# Patient Record
Sex: Male | Born: 1975 | Race: Black or African American | Hispanic: No | Marital: Single | Smoking: Current every day smoker
Health system: Southern US, Community
[De-identification: ages and names within clinical notes are randomized; demographics above are authoritative.]

## PROBLEM LIST (undated history)

## (undated) DIAGNOSIS — I10 Essential (primary) hypertension: Secondary | ICD-10-CM

## (undated) DIAGNOSIS — S02609A Fracture of mandible, unspecified, initial encounter for closed fracture: Secondary | ICD-10-CM

## (undated) DIAGNOSIS — M1990 Unspecified osteoarthritis, unspecified site: Secondary | ICD-10-CM

## (undated) HISTORY — PX: KNEE SURGERY: SHX244

---

## 2003-05-10 ENCOUNTER — Emergency Department (HOSPITAL_COMMUNITY): Admission: EM | Admit: 2003-05-10 | Discharge: 2003-05-10 | Payer: Self-pay | Admitting: Emergency Medicine

## 2003-05-10 ENCOUNTER — Encounter: Payer: Self-pay | Admitting: Emergency Medicine

## 2003-05-14 ENCOUNTER — Emergency Department (HOSPITAL_COMMUNITY): Admission: EM | Admit: 2003-05-14 | Discharge: 2003-05-14 | Payer: Self-pay | Admitting: Emergency Medicine

## 2014-01-09 ENCOUNTER — Emergency Department (HOSPITAL_COMMUNITY)
Admission: EM | Admit: 2014-01-09 | Discharge: 2014-01-09 | Disposition: A | Discharge status: Home / Self Care | Payer: Self-pay | Attending: Emergency Medicine | Admitting: Emergency Medicine

## 2014-01-09 ENCOUNTER — Encounter (HOSPITAL_COMMUNITY): Payer: Self-pay | Admitting: Emergency Medicine

## 2014-01-09 DIAGNOSIS — K921 Melena: Secondary | ICD-10-CM | POA: Insufficient documentation

## 2014-01-09 DIAGNOSIS — R109 Unspecified abdominal pain: Secondary | ICD-10-CM

## 2014-01-09 DIAGNOSIS — R1013 Epigastric pain: Secondary | ICD-10-CM | POA: Insufficient documentation

## 2014-01-09 DIAGNOSIS — F17200 Nicotine dependence, unspecified, uncomplicated: Secondary | ICD-10-CM | POA: Insufficient documentation

## 2014-01-09 DIAGNOSIS — Z79899 Other long term (current) drug therapy: Secondary | ICD-10-CM | POA: Insufficient documentation

## 2014-01-09 DIAGNOSIS — Z8739 Personal history of other diseases of the musculoskeletal system and connective tissue: Secondary | ICD-10-CM | POA: Insufficient documentation

## 2014-01-09 DIAGNOSIS — K625 Hemorrhage of anus and rectum: Secondary | ICD-10-CM | POA: Insufficient documentation

## 2014-01-09 HISTORY — DX: Unspecified osteoarthritis, unspecified site: M19.90

## 2014-01-09 LAB — COMPREHENSIVE METABOLIC PANEL
ALK PHOS: 52 U/L (ref 39–117)
ALT: 22 U/L (ref 0–53)
AST: 23 U/L (ref 0–37)
Albumin: 3.8 g/dL (ref 3.5–5.2)
BILIRUBIN TOTAL: 0.4 mg/dL (ref 0.3–1.2)
BUN: 17 mg/dL (ref 6–23)
CHLORIDE: 102 meq/L (ref 96–112)
CO2: 30 meq/L (ref 19–32)
Calcium: 9.6 mg/dL (ref 8.4–10.5)
Creatinine, Ser: 1.04 mg/dL (ref 0.50–1.35)
GFR calc Af Amer: >90 mL/min (ref >90)
GFR, EST NON AFRICAN AMERICAN: 90 mL/min — AB (ref >90)
Glucose, Bld: 104 mg/dL — ABNORMAL HIGH (ref 70–99)
Potassium: 5 mEq/L (ref 3.7–5.3)
SODIUM: 142 meq/L (ref 137–147)
Total Protein: 7.3 g/dL (ref 6.0–8.3)

## 2014-01-09 LAB — CBC
HCT: 42.1 % (ref 39.0–52.0)
Hemoglobin: 14 g/dL (ref 13.0–17.0)
MCH: 29.7 pg (ref 26.0–34.0)
MCHC: 33.3 g/dL (ref 30.0–36.0)
MCV: 89.4 fL (ref 78.0–100.0)
PLATELETS: 247 10*3/uL (ref 150–400)
RBC: 4.71 MIL/uL (ref 4.22–5.81)
RDW: 14.7 % (ref 11.5–15.5)
WBC: 6.1 10*3/uL (ref 4.0–10.5)

## 2014-01-09 LAB — PROTIME-INR
INR: 0.99 (ref 0.00–1.49)
Prothrombin Time: 12.9 seconds (ref 11.6–15.2)

## 2014-01-09 LAB — OCCULT BLOOD, POC DEVICE: FECAL OCCULT BLD: NEGATIVE

## 2014-01-09 LAB — APTT: aPTT: 31 seconds (ref 24–37)

## 2014-01-09 MED ORDER — OMEPRAZOLE 20 MG PO CPDR: 20.0000 mg | DELAYED_RELEASE_CAPSULE | Freq: Every day | ORAL | Status: DC

## 2014-01-09 MED ORDER — SODIUM CHLORIDE 0.9 % IV SOLN
1000.0000 mL | INTRAVENOUS | Status: DC
  Administered 2014-01-09: 1000 mL via INTRAVENOUS

## 2014-01-09 NOTE — ED Notes (Signed)
Pt reports bleeding in stool that was dark red. Last bloody stool was a week ago and pt stated he noticed clots. Pt also reports ab pain that is generalized that comes and goes and is sharp.

## 2014-01-09 NOTE — Progress Notes (Signed)
[facility] provided pt with a list of primary care resources, [facility] information, and a [facility] [employer] application.  °

## 2014-01-09 NOTE — ED Provider Notes (Signed)
CSN: [id number]     Arrival date & time 01/09/14  8295 History   First MD Initiated Contact with Patient 01/09/14 [phone]     Chief Complaint  Patient presents with  . Abdominal Pain  . Rectal Bleeding    Patient is a 38 y.o. male presenting with abdominal pain and hematochezia. The history is provided by the patient.  Abdominal Pain Pain location:  Epigastric Pain quality: sharp   Pain radiates to:  Does not radiate Duration:  3 weeks (intermittent, lasts for 30 seconds at a time) Timing:  Intermittent Context: not alcohol use, not eating, not sick contacts, not suspicious food intake and not trauma   Relieved by: sometimes pressing on his abdomen helps. Worsened by:  Nothing tried Associated symptoms: hematochezia   Associated symptoms: no anorexia, no cough, no diarrhea, no dysuria, no fatigue, no fever, no shortness of breath and no vomiting   Rectal Bleeding Quality: Pt noticed blood in his stool after having a bowel movement and wiping.  He did notice a clot in his stool about one week ago.  None since that time. Associated symptoms: abdominal pain   Associated symptoms: no fever and no vomiting    Pt was reading about his symptoms on the internet and was concerned about the possibility of colon cancer.  No family history of this.  No weight loss, fevers, vomiting, diarrhea.  Past Medical History  Diagnosis Date  . Arthritis    Past Surgical History  Procedure Laterality Date  . Knee surgery     No family history on file. History  Substance Use Topics  . Smoking status: Current Every Day Smoker    Types: Cigarettes  . Smokeless tobacco: Not on file  . Alcohol Use: No    Review of Systems  Constitutional: Negative for fever and fatigue.  Respiratory: Negative for cough and shortness of breath.   Gastrointestinal: Positive for abdominal pain and hematochezia. Negative for vomiting, diarrhea and anorexia.  Genitourinary: Negative for dysuria.  All other systems  reviewed and are negative.    Allergies  Review of patient's allergies indicates no known allergies.  Home Medications   Current Outpatient Rx  Name  Route  Sig  Dispense  Refill  . omeprazole (PRILOSEC) 20 MG capsule   Oral   Take 1 capsule (20 mg total) by mouth daily.   14 capsule   1    BP 140/62  Pulse 54  Temp(Src) 97.9 F (36.6 C) (Oral)  Resp 20  Ht 5\' 10"  (1.778 m)  Wt 185 lb (83.915 kg)  BMI 26.54 kg/m2  SpO2 97% Physical Exam  Nursing note and vitals reviewed. Constitutional: He appears well-developed and well-nourished. No distress.  HENT:  Head: Normocephalic and atraumatic.  Right Ear: External ear normal.  Left Ear: External ear normal.  Eyes: Conjunctivae are normal. Right eye exhibits no discharge. Left eye exhibits no discharge. No scleral icterus.  Neck: Neck supple. No tracheal deviation present.  Cardiovascular: Normal rate, regular rhythm and intact distal pulses.   Pulmonary/Chest: Effort normal and breath sounds normal. No stridor. No respiratory distress. He has no wheezes. He has no rales.  Abdominal: Soft. Bowel sounds are normal. He exhibits no distension. There is no tenderness. There is no rebound and no guarding.  Genitourinary: Rectum normal.  Musculoskeletal: He exhibits no edema and no tenderness.  Neurological: He is alert. He has normal strength. No cranial nerve deficit (no facial droop, extraocular movements intact, no slurred speech) or sensory  deficit. He exhibits normal muscle tone. He displays no seizure activity. Coordination normal.  Skin: Skin is warm and dry. No rash noted.  Psychiatric: He has a normal mood and affect.    ED Course  Procedures (including critical care time) Labs Review Labs Reviewed  COMPREHENSIVE METABOLIC PANEL - Abnormal; Notable for the following:    Glucose, Bld 104 (*)    GFR calc non Af Amer 90 (*)    All other components within normal limits  CBC  APTT  PROTIME-INR  OCCULT BLOOD, POC  DEVICE    MDM   1. Abdominal pain    Will try PPI.   Follow up with a PCP.  At this time there does not appear to be any evidence of an acute emergency medical condition and the patient appears stable for discharge with appropriate outpatient follow up.     Celene KrasJon R Judiann Celia, MD 01/09/14 1005

## 2014-01-09 NOTE — Discharge Instructions (Signed)
Abdominal Pain, Adult °Many things can cause abdominal pain. Usually, abdominal pain is not caused by a disease and will improve without treatment. It can often be observed and treated at home. Your health care provider will do a physical exam and possibly order blood tests and X-rays to help determine the seriousness of your pain. However, in many cases, more time must pass before a clear cause of the pain can be found. Before that point, your health care provider may not know if you need more testing or further treatment. °HOME CARE INSTRUCTIONS  °Monitor your abdominal pain for any changes. The following actions may help to alleviate any discomfort you are experiencing: °· Only take over-the-counter or prescription medicines as directed by your health care provider. °· Do not take laxatives unless directed to do so by your health care provider. °· Try a clear liquid diet (broth, tea, or water) as directed by your health care provider. Slowly move to a bland diet as tolerated. °SEEK MEDICAL CARE IF: °· You have unexplained abdominal pain. °· You have abdominal pain associated with nausea or diarrhea. °· You have pain when you urinate or have a bowel movement. °· You experience abdominal pain that wakes you in the night. °· You have abdominal pain that is worsened or improved by eating food. °· You have abdominal pain that is worsened with eating fatty foods. °SEEK IMMEDIATE MEDICAL CARE IF:  °· Your pain does not go away within 2 hours. °· You have a fever. °· You keep throwing up (vomiting). °· Your pain is felt only in portions of the abdomen, such as the right side or the left lower portion of the abdomen. °· You pass bloody or black tarry stools. °MAKE SURE YOU: °· Understand these instructions.   °· Will watch your condition.   °· Will get help right away if you are not doing well or get worse.   °Document Released: 09/08/2005 Document Revised: 09/19/2013 Document Reviewed: 08/08/2013 °[employer]® Patient  Information ©2014 [employer]. ° °

## 2014-01-14 ENCOUNTER — Emergency Department (HOSPITAL_COMMUNITY)
Admission: EM | Admit: 2014-01-14 | Discharge: 2014-01-14 | Disposition: A | Discharge status: Home / Self Care | Payer: Self-pay | Attending: Emergency Medicine | Admitting: Emergency Medicine

## 2014-01-14 ENCOUNTER — Emergency Department (HOSPITAL_COMMUNITY): Payer: Self-pay

## 2014-01-14 ENCOUNTER — Encounter (HOSPITAL_COMMUNITY): Payer: Self-pay | Admitting: Emergency Medicine

## 2014-01-14 DIAGNOSIS — Z79899 Other long term (current) drug therapy: Secondary | ICD-10-CM | POA: Insufficient documentation

## 2014-01-14 DIAGNOSIS — F17200 Nicotine dependence, unspecified, uncomplicated: Secondary | ICD-10-CM | POA: Insufficient documentation

## 2014-01-14 DIAGNOSIS — K279 Peptic ulcer, site unspecified, unspecified as acute or chronic, without hemorrhage or perforation: Secondary | ICD-10-CM | POA: Insufficient documentation

## 2014-01-14 DIAGNOSIS — Z8739 Personal history of other diseases of the musculoskeletal system and connective tissue: Secondary | ICD-10-CM | POA: Insufficient documentation

## 2014-01-14 LAB — CBC WITH DIFFERENTIAL/PLATELET
BASOS PCT: 0 % (ref 0–1)
Basophils Absolute: 0 10*3/uL (ref 0.0–0.1)
Eosinophils Absolute: 0.1 10*3/uL (ref 0.0–0.7)
Eosinophils Relative: 3 % (ref 0–5)
HEMATOCRIT: 41 % (ref 39.0–52.0)
HEMOGLOBIN: 13.8 g/dL (ref 13.0–17.0)
LYMPHS ABS: 2.4 10*3/uL (ref 0.7–4.0)
Lymphocytes Relative: 48 % — ABNORMAL HIGH (ref 12–46)
MCH: 29.8 pg (ref 26.0–34.0)
MCHC: 33.7 g/dL (ref 30.0–36.0)
MCV: 88.6 fL (ref 78.0–100.0)
MONO ABS: 0.4 10*3/uL (ref 0.1–1.0)
MONOS PCT: 9 % (ref 3–12)
NEUTROS ABS: 2.1 10*3/uL (ref 1.7–7.7)
Neutrophils Relative %: 41 % — ABNORMAL LOW (ref 43–77)
Platelets: 235 10*3/uL (ref 150–400)
RBC: 4.63 MIL/uL (ref 4.22–5.81)
RDW: 14.9 % (ref 11.5–15.5)
WBC: 5.1 10*3/uL (ref 4.0–10.5)

## 2014-01-14 LAB — URINALYSIS, ROUTINE W REFLEX MICROSCOPIC
BILIRUBIN URINE: NEGATIVE
Glucose, UA: NEGATIVE mg/dL
Hgb urine dipstick: NEGATIVE
Ketones, ur: NEGATIVE mg/dL
LEUKOCYTES UA: NEGATIVE
NITRITE: NEGATIVE
PROTEIN: NEGATIVE mg/dL
Specific Gravity, Urine: 1.028 (ref 1.005–1.030)
Urobilinogen, UA: 1 mg/dL (ref 0.0–1.0)
pH: 6.5 (ref 5.0–8.0)

## 2014-01-14 LAB — COMPREHENSIVE METABOLIC PANEL
ALT: 29 U/L (ref 0–53)
AST: 25 U/L (ref 0–37)
Albumin: 3.7 g/dL (ref 3.5–5.2)
Alkaline Phosphatase: 50 U/L (ref 39–117)
BUN: 12 mg/dL (ref 6–23)
CO2: 27 mEq/L (ref 19–32)
CREATININE: 0.95 mg/dL (ref 0.50–1.35)
Calcium: 9.5 mg/dL (ref 8.4–10.5)
Chloride: 100 mEq/L (ref 96–112)
GFR calc Af Amer: >90 mL/min (ref >90)
GLUCOSE: 101 mg/dL — AB (ref 70–99)
Potassium: 4.2 mEq/L (ref 3.7–5.3)
Sodium: 138 mEq/L (ref 137–147)
Total Bilirubin: 0.4 mg/dL (ref 0.3–1.2)
Total Protein: 7 g/dL (ref 6.0–8.3)

## 2014-01-14 LAB — OCCULT BLOOD, POC DEVICE: Fecal Occult Bld: NEGATIVE

## 2014-01-14 LAB — LIPASE, BLOOD: Lipase: 27 U/L (ref 11–59)

## 2014-01-14 NOTE — ED Notes (Signed)
US at bedside

## 2014-01-14 NOTE — ED Notes (Signed)
Pt reports he was seen on 1/28 for same intermittent generalized abdominal pain. At present pain 5/10. Denies vomiting. Pt was given prilosec last visit, reports he has been compliant with medication.

## 2014-01-14 NOTE — ED Notes (Signed)
MD at bedside. 

## 2014-01-14 NOTE — Discharge Instructions (Signed)
Use TUMS or Maalox before meals and at bedtime for one week.  Try to stop smoking.  Followup with a primary care doctor for checkup in 2-3 weeks.    Peptic Ulcer A peptic ulcer is a sore in the lining of in your esophagus (esophageal ulcer), stomach (gastric ulcer), or in the first part of your small intestine (duodenal ulcer). The ulcer causes erosion into the deeper tissue. CAUSES  Normally, the lining of the stomach and the small intestine protects itself from the acid that digests food. The protective lining can be damaged by:  An infection caused by a bacterium called Helicobacter pylori (H. pylori).  Regular use of nonsteroidal anti-inflammatory drugs (NSAIDs), such as ibuprofen or aspirin.  Smoking tobacco. Other risk factors include being older than 50, drinking alcohol excessively, and having a family history of ulcer disease.  SYMPTOMS   Burning pain or gnawing in the area between the chest and the belly button.  Heartburn.  Nausea and vomiting.  Bloating. The pain can be worse on an empty stomach and at night. If the ulcer results in bleeding, it can cause:  Black, tarry stools.  Vomiting of bright red blood.  Vomiting of coffee ground looking materials. DIAGNOSIS  A diagnosis is usually made based upon your history and an exam. Other tests and procedures may be performed to find the cause of the ulcer. Finding a cause will help determine the best treatment. Tests and procedures may include:  Blood tests, stool tests, or breath tests to check for the bacterium H. pylori.  An upper gastrointestinal (GI) series of the esophagus, stomach, and small intestine.  An endoscopy to examine the esophagus, stomach, and small intestine.  A biopsy. TREATMENT  Treatment may include:  Eliminating the cause of the ulcer, such as smoking, NSAIDs, or alcohol.  Medicines to reduce the amount of acid in your digestive tract.  Antibiotic medicines if the ulcer is caused by  the H. pylori bacterium.  An upper endoscopy to treat a bleeding ulcer.  Surgery if the bleeding is severe or if the ulcer created a hole somewhere in the digestive system. HOME CARE INSTRUCTIONS   Avoid tobacco, alcohol, and caffeine. Smoking can increase the acid in the stomach, and continued smoking will impair the healing of ulcers.  Avoid foods and drinks that seem to cause discomfort or aggravate your ulcer.  Only take medicines as directed by your caregiver. Do not substitute over-the-counter medicines for prescription medicines without talking to your caregiver.  Keep any follow-up appointments and tests as directed. SEEK MEDICAL CARE IF:   Your do not improve within 7 days of starting treatment.  You have ongoing indigestion or heartburn. SEEK IMMEDIATE MEDICAL CARE IF:   You have sudden, sharp, or persistent abdominal pain.  You have bloody or dark black, tarry stools.  You vomit blood or vomit that looks like coffee grounds.  You become light headed, weak, or feel faint.  You become sweaty or clammy. MAKE SURE YOU:   Understand these instructions.  Will watch your condition.  Will get help right away if you are not doing well or get worse. Document Released: 11/26/2000 Document Revised: 08/23/2012 Document Reviewed: 06/28/2012 [facility] Patient Information 2014 [place], [place].   Emergency Department Resource Guide 1) Find a Doctor and Pay Out of Pocket Although you won't have to find out who is covered by your insurance plan, it is a good idea to ask around and get recommendations. You will then need to call the  office and see if the doctor you have chosen will accept you as a new patient and what types of options they offer for patients who are self-pay. Some doctors offer discounts or will set up payment plans for their patients who do not have insurance, but you will need to ask so you aren't surprised when you get to your appointment.  2) Contact Your Local  Health Department Not all health departments have doctors that can see patients for sick visits, but many do, so it is worth a call to see if yours does. If you don't know where your local health department is, you can check in your phone book. The CDC also has a tool to help you locate your state's health department, and many state websites also have listings of all of their local health departments.  3) Find a Walk-in Clinic If your illness is not likely to be very severe or complicated, you may want to try a walk in clinic. These are popping up all over the country in pharmacies, drugstores, and shopping centers. They're usually staffed by nurse practitioners or physician assistants that have been trained to treat common illnesses and complaints. They're usually fairly quick and inexpensive. However, if you have serious medical issues or chronic medical problems, these are probably not your best option.  No Primary Care Doctor: - Call Health Connect at  [phone] - they can help you locate a primary care doctor that  accepts your insurance, provides certain services, etc. - Physician Referral Service- [phone]  Chronic Pain Problems: Organization         Address  Phone   Notes  [facility]  [phone] Patients need to be referred by their primary care doctor.   Medication Assistance: Organization         Address  Phone   Notes  [facility] Medication [facility] [address] [place] [place], [place] [address] [phone] --Must be a resident of [facility] -- Must have NO insurance coverage whatsoever (no Medicaid/ Medicare, etc.) -- The pt. MUST have a primary care doctor that directs their care regularly and follows them in the community   Med[facility]ist  [phone]   [employer]  [phone]    Agencies that provide inexpensive medical care: Organization         Address  Phone   Notes  [facility]  [phone]    [facility]    [phone]   [facility] [address] [place], [place] [address] [phone]   [facility] [address] [place] [place] [phone]   Planned Parenthood    [phone]   [facility]    [phone]   [facility]  [address], [place] Phone:  [phone], Fax:  [phone] Hours of Operation:  9 am - 6 pm, M-F.  Also accepts Medicaid/Medicare and self-pay.  [facility]  [address], [place] Phone: [phone], Fax: [phone]. Hours of Operation:  8:30 am - 5:30 pm, M-F.  Also accepts Medicaid and self-pay.  [facility] [place] [address], [place] Phone: [phone]   [facility] [address] Natasha Bence [place], [place] [phone], Ext. [phone] Mondays & Thursdays: 7-9 AM.  First 15 patients are seen on a first come, first serve basis.    Medicaid-accepting  [facility] Providers:  Organization         Address  Phone   Notes  [facility] [address], [place] [phone] Also accepts self-pay patients.  [facility] [address], [place]  [phone]) [phone]   [facility] [address], [place] [phone]) [phone]   [facility] [address] [place] [address], [place] [phone]) [phone]   Renaye RakersVeita Bland [address], [place]   [phone]) [phone] Only accepts [place] Access [place] patients after they have their name applied to their card.   Self-Pay (no insurance) in [address] [facility]:  Organization         Address  Phone   Notes  Sickle Cell Patients, [facility] [address], [place] [phone]   [facility] [address], [place] [phone]) [phone]   [facility] [place]  [address],  [place] [phone]) [phone]   [facility]/Dr. Osei-Bonsu  [address] [place] [address], [place] or [phone], [place] [phone] Phone number for both [place] and [place] locations is the same.  [facility] [address], [place] [phone]   [facility] [address] [place] [address], [place] or [address] [phone]) [phone]   [facility] [address], [place] [phone](336) [phone], phone; [phone](336) [phone], fax Sees patients 1st and 3rd Saturday of every month.  Must not qualify for public or private insurance (i.e. Medicaid, Medicare, [place] Health Choice, Veterans' Benefits)  Household income should be no more than 200% of the poverty level The clinic cannot treat you if you are pregnant or think you are pregnant  Sexually transmitted diseases are not treated at the clinic.    Dental Care: Organization         Address  Phone  Notes  [address] [facility] Department of [facility] [address], [place] [phone]) [phone] Accepts children up to age 39 who are enrolled in [place] or [place] Health Choice; pregnant women with a Medicaid card; and children who have applied for Medicaid or [place] Health Choice, but were declined, whose parents can pay a reduced fee at time of service.  [facility] of [facility] [place]  [address], [place] [phone] Accepts children up to age 39 who are enrolled in [place] or [place] Health Choice; pregnant women with a Medicaid card; and children who have applied for Medicaid or [place] Health Choice, but were declined, whose parents can pay a reduced fee at time of service.  [facility]  [address], [place] [phone]) [phone] Patients are seen by appointment only. Walk-ins are not accepted. [facility] will see patients 39 years of age and older. Monday - Tuesday (8am-5pm) Most Wednesdays  (8:30-5pm) $30 per visit, cash only  [facility]  [address], [facility] [phone]) [phone] Patients are seen by appointment only. Walk-ins are not accepted. [facility] will see patients 39 years of age and older. One Wednesday Evening (Monthly: Volunteer Based).  $30 per visit, cash only  [facility]  [phone] for adults; Children under age 134, call Graduate Pediatric Dentistry at [phone]. Children aged 154-14, please call [phone] to request a pediatric  application.  Dental services are provided in all areas of dental care including fillings, crowns and bridges, complete and partial dentures, implants, gum treatment, root canals, and extractions. Preventive care is also provided. Treatment is provided to both adults and children. Patients are selected via a lottery and there is often a waiting list.   [facility] [address], [place]  [phone] [email or web address]   [facility] [address] [place], [place] [phone], Ext. [phone] Second and Fourth Thursday of each month, opens at 6:30 AM; Clinic ends at 9 AM.  Patients are seen on a first-come first-served basis, and a limited number are seen during each clinic.   [facility]  [address] Ether Griffins [place], [place] [phone]   Eligibility Requirements You must have lived in [place], [place], or [place] counties for at least the last three months.   You cannot be eligible for state or federal sponsored [place], including [employer], [place], or [employer].   You generally cannot be eligible for healthcare insurance through your employer.    How to apply: Eligibility screenings are held every Tuesday and Wednesday afternoon from 1:00 pm until 4:00 pm. You do not need an appointment for the interview!  [facility] [address] [place], [place] [phone]   [facility]  Health Department  [phone]   [facility] Health Department  [phone]   [facility] Health Department  [phone]    Behavioral Health Resources in the Community: Intensive Outpatient Programs Organization         Address  Phone  Notes  [facility] [address] [place], [place] [phone]   [facility] Outpatient [address] [place], [place] [phone]   [facility] [address] [place], [place]  [phone]   [facility] [address],  [place], [place] [phone] or [phone]   Substance Abuse Resources Organization         Address  Phone  Notes  [facility] Drug Services  [phone]   [facility]  [phone]   [facility] [place]  [phone]   Floydene Flock  [phone]   Residential & Outpatient Substance Abuse Program  [phone]   Psychological Services Organization         Address  Phone  Notes  [facility]  [phone]   [facility] Services  [phone]   [facility] Mental [facility] [address] [place] [phone] or [phone]    Mobile Crisis Teams Organization         Address  Phone  Notes  [facility], Mobile Crisis Care Unit  [phone]   [facility]  [address] [place], [place] [phone]   Doristine Locks [address] [place] [phone]    Self-Help/Support Groups Organization         Address  Phone             Notes  [facility]oc. of [place] - variety of support groups  336- I7437963 Call for more information  Narcotics Anonymous (NA), [facility] [address], [employer] [place]  2 meetings at this location   Statistician         Address  Phone  Notes  [facility] [address],    [place]  [phone]   [facility]  [address], [place] [address], [place],  [facility]  [phone]   [facility] [address], [place] [phone] Admissions: 8am-3pm M-F  [facility] [address]    [place], [place] [phone]   [facility] [address], [place], [place] [phone]   [facility] [address]  [place], [place] [phone]   [facility] - Intensive Outpatient [address], [place], [place] [phone]   [facility] ([facility] [address]  [place], [place] [phone] or [phone]   [facility] (RT[facility]) [address] [place], [place] [phone] Accepts Medicaid  [facility] [address]  [place] [phone] Substance Abuse/Addiction Treatment   [facility] Behavioral Health Resources Organization         Address  Phone  Notes  [facility]  [phone]) [phone]   Angie FavaJulie Brannon, PhD [address] [place], [place]   [phone](336) [phone] or [phone](336) [phone]   [facility]   [address] [place], [place] [phone](336) [phone]   [facility] [address], [place], [place] [phone]) [phone] Insurance/Medicaid/sponsorship through [facility]  [facility] [address]                                    [place], [place] [phone]) [phone] Therapy/tele-psych/case  [facility] [address]   [place], [place] [phone]) [phone]    Dr. Lolly MustacheArfeen  [phone]) [phone]   [facility]  [employer] [facility]. 1) [address], [place] 2) [address], [place] 3)  [address], [place] [phone](336) [phone]  [phone]   [facility] Child Abuse Hotline [phone](336) [phone] or [phone](336) [phone] (After Hours)

## 2014-01-14 NOTE — ED Provider Notes (Signed)
CSN: [id number]     Arrival date & time 01/14/14  0702 History   First MD Initiated Contact with Patient 01/14/14 [phone]     Chief Complaint  Patient presents with  . Abdominal Pain   (Consider location/radiation/quality/duration/timing/severity/associated sxs/prior Treatment) Patient is a 38 y.o. male presenting with abdominal pain. The history is provided by the patient.  Abdominal Pain  He presents for evaluation of upper abdominal pain. He has had reflux symptoms for many years. He is here 3 days ago with upper abdominal pain and rectal bleeding, was diagnosed with gastritis/PUD. He was treated with omeprazole, which he is taking daily for 3 days. He has not had any improvement. He had several episodes of rectal bleeding about 2 weeks ago, but none since. He is eating well. He denies weakness. No near-syncope or syncope. He did not take chronic medications. He smokes cigarettes. He does not drink alcohol. There are no other known modifying factors.  Past Medical History  Diagnosis Date  . Arthritis    Past Surgical History  Procedure Laterality Date  . Knee surgery     History reviewed. No pertinent family history. History  Substance Use Topics  . Smoking status: Current Every Day Smoker    Types: Cigarettes  . Smokeless tobacco: Not on file  . Alcohol Use: No    Review of Systems  Gastrointestinal: Positive for abdominal pain.  All other systems reviewed and are negative.    Allergies  Review of patient's allergies indicates no known allergies.  Home Medications   Current Outpatient Rx  Name  Route  Sig  Dispense  Refill  . omeprazole (PRILOSEC) 20 MG capsule   Oral   Take 1 capsule (20 mg total) by mouth daily.   14 capsule   1   . OVER THE COUNTER MEDICATION   Oral   Take 1 tablet by mouth daily. Over the counter anti acid relief         . OVER THE COUNTER MEDICATION      1 drop daily as needed (dryness). 1 drop each eye          BP 128/82  Pulse 53   Temp(Src) 98.3 F (36.8 C) (Oral)  Resp 16  Wt 185 lb (83.915 kg)  SpO2 98% Physical Exam  Nursing note and vitals reviewed. Constitutional: He is oriented to person, place, and time. He appears well-developed and well-nourished. No distress.  HENT:  Head: Normocephalic and atraumatic.  Right Ear: External ear normal.  Left Ear: External ear normal.  Eyes: Conjunctivae and EOM are normal. Pupils are equal, round, and reactive to light.  Neck: Normal range of motion and phonation normal. Neck supple.  Cardiovascular: Normal rate, regular rhythm, normal heart sounds and intact distal pulses.   Pulmonary/Chest: Effort normal and breath sounds normal. He exhibits no bony tenderness.  Abdominal: Soft. Normal appearance. There is no tenderness.  Genitourinary:  Normal anus, rectum and prostate. No stool in rectal vault. Mucous sent for testing for blood.  Musculoskeletal: Normal range of motion.  Neurological: He is alert and oriented to person, place, and time. No cranial nerve deficit or sensory deficit. He exhibits normal muscle tone. Coordination normal.  Skin: Skin is warm, dry and intact.  Psychiatric: He has a normal mood and affect. His behavior is normal. Judgment and thought content normal.    ED Course  Procedures (including critical care time)  Medications - No data to display  Patient Vitals for the past 24 hrs:  BP Temp Temp src Pulse Resp SpO2 Weight  01/14/14 1029 128/82 mmHg - - 53 16 98 % -  01/14/14 0810 119/74 mmHg 98.3 F (36.8 C) Oral 51 20 97 % -  01/14/14 0707 124/72 mmHg 98.3 F (36.8 C) Oral 64 18 98 % 185 lb (83.915 kg)      Labs Review Labs Reviewed  CBC WITH DIFFERENTIAL - Abnormal; Notable for the following:    Neutrophils Relative % 41 (*)    Lymphocytes Relative 48 (*)    All other components within normal limits  COMPREHENSIVE METABOLIC PANEL - Abnormal; Notable for the following:    Glucose, Bld 101 (*)    All other components within  normal limits  LIPASE, BLOOD  URINALYSIS, ROUTINE W REFLEX MICROSCOPIC  OCCULT BLOOD, POC DEVICE   Imaging Review [place] Abdomen Complete  01/14/2014   CLINICAL DATA:  Abdominal pain  EXAM: ULTRASOUND ABDOMEN COMPLETE  COMPARISON:  None.  FINDINGS: Gallbladder:  No gallstones or wall thickening visualized. There is no pericholecystic fluid. No sonographic Murphy sign noted.  Common bile duct:  Diameter: 4 mm. There is no intrahepatic, common hepatic, or common bile duct dilatation.  Liver:  No focal lesion identified. Within normal limits in parenchymal echogenicity.  IVC:  No abnormality visualized.  Pancreas:  No mass or inflammatory focus.  Spleen:  Size and appearance within normal limits.  Right Kidney:  Length: 11.3 cm. Echogenicity within normal limits. No mass or hydronephrosis visualized.  Left Kidney:  Length: 11.6 cm. Echogenicity within normal limits. No mass or hydronephrosis visualized.  Abdominal aorta:  No aneurysm visualized.  Other findings:  There is no demonstrable ascites.  IMPRESSION: Study within normal limits.   Electronically Signed   By: Bretta BangWilliam  Woodruff M.D.   On: 01/14/2014 08:51    EKG Interpretation   None       MDM   1. Peptic ulcer disease    Nonspecific chest pain, most likely PUD/GERD. Doubt ACS, PE, or PNE  Nursing Notes Reviewed/ Care Coordinated Applicable Imaging Reviewed Interpretation of Laboratory Data incorporated into ED treatment  The patient appears reasonably screened and/or stabilized for discharge and I doubt any other medical condition or other [facility] requiring further screening, evaluation, or treatment in the ED at this time prior to discharge.  Plan: Home Medications- Antacids AC/HS; Home Treatments- stop smoking; return here if the recommended treatment, does not improve the symptoms; Recommended follow up- PCP prn    Flint MelterElliott L Avarae Zwart, MD 01/14/14 2032

## 2014-07-29 ENCOUNTER — Emergency Department (HOSPITAL_COMMUNITY)
Admission: EM | Admit: 2014-07-29 | Discharge: 2014-07-29 | Disposition: A | Discharge status: Home / Self Care | Payer: Self-pay | Attending: Emergency Medicine | Admitting: Emergency Medicine

## 2014-07-29 ENCOUNTER — Encounter (HOSPITAL_COMMUNITY): Payer: Self-pay | Admitting: Emergency Medicine

## 2014-07-29 DIAGNOSIS — S335XXA Sprain of ligaments of lumbar spine, initial encounter: Secondary | ICD-10-CM | POA: Insufficient documentation

## 2014-07-29 DIAGNOSIS — Y9289 Other specified places as the place of occurrence of the external cause: Secondary | ICD-10-CM | POA: Insufficient documentation

## 2014-07-29 DIAGNOSIS — Z8739 Personal history of other diseases of the musculoskeletal system and connective tissue: Secondary | ICD-10-CM | POA: Insufficient documentation

## 2014-07-29 DIAGNOSIS — Z79899 Other long term (current) drug therapy: Secondary | ICD-10-CM | POA: Insufficient documentation

## 2014-07-29 DIAGNOSIS — Y9389 Activity, other specified: Secondary | ICD-10-CM | POA: Insufficient documentation

## 2014-07-29 DIAGNOSIS — S39012A Strain of muscle, fascia and tendon of lower back, initial encounter: Secondary | ICD-10-CM

## 2014-07-29 DIAGNOSIS — X500XXA Overexertion from strenuous movement or load, initial encounter: Secondary | ICD-10-CM | POA: Insufficient documentation

## 2014-07-29 DIAGNOSIS — F17200 Nicotine dependence, unspecified, uncomplicated: Secondary | ICD-10-CM | POA: Insufficient documentation

## 2014-07-29 DIAGNOSIS — IMO0002 Reserved for concepts with insufficient information to code with codable children: Secondary | ICD-10-CM | POA: Insufficient documentation

## 2014-07-29 MED ORDER — KETOROLAC TROMETHAMINE 60 MG/2ML IM SOLN
60.0000 mg | Freq: Once | INTRAMUSCULAR | Status: AC
  Administered 2014-07-29: 60 mg via INTRAMUSCULAR
  Filled 2014-07-29: qty 2

## 2014-07-29 MED ORDER — CYCLOBENZAPRINE HCL 10 MG PO TABS: 10.0000 mg | ORAL_TABLET | Freq: Three times a day (TID) | ORAL | Status: DC | PRN

## 2014-07-29 MED ORDER — PREDNISONE 50 MG PO TABS: 50.0000 mg | ORAL_TABLET | Freq: Every day | ORAL | Status: DC

## 2014-07-29 MED ORDER — OXYCODONE-ACETAMINOPHEN 5-325 MG PO TABS: 1.0000 | ORAL_TABLET | Freq: Four times a day (QID) | ORAL | Status: DC | PRN

## 2014-07-29 MED ORDER — OXYCODONE-ACETAMINOPHEN 5-325 MG PO TABS
1.0000 | ORAL_TABLET | Freq: Once | ORAL | Status: DC
  Filled 2014-07-29: qty 1

## 2014-07-29 NOTE — Discharge Instructions (Signed)
Return here as needed. Follow up with the clinic provided. Ice and heat on your back

## 2014-07-29 NOTE — ED Provider Notes (Signed)
CSN: [id number]     Arrival date & time 07/29/14  1904 History  This chart was scribed for non-physician practitioner, Ebbie Ridgehris Kiwan Gadsden, PA-C working with Raeford RazorStephen Kohut, MD by Luisa DagoPriscilla Tutu, ED scribe. This patient was seen in room WTR5/WTR5 and the patient's care was started at 9:12 PM.    Chief Complaint  Patient presents with  . Back Pain   HPI HPI Comments: Bobby Holmes is a 38 y.o. male who presents to the Emergency Department complaining of worsening back pain that started today PTA. Pt states that he may have pulled a muscle in his lower back. Bobby Holmes states that he was carrying a box and tried to maneuver over a chair in-front of him when he felt the sudden pain. He states that the pain does not radiate. The pain is worsened by walking and deep breathing. Pt does have a history of a similar episode, he states that this current episode feels the same. He denies any fever, chills, nausea, emesis, SOB, chest pain, congestion, or saddle paraesthesia.   Past Medical History  Diagnosis Date  . Arthritis    Past Surgical History  Procedure Laterality Date  . Knee surgery     No family history on file. History  Substance Use Topics  . Smoking status: Current Every Day Smoker    Types: Cigarettes  . Smokeless tobacco: Not on file  . Alcohol Use: No    Review of Systems A complete 10 system review of systems was obtained and all systems are negative except as noted in the HPI and PMH.   Allergies  Review of patient's allergies indicates no known allergies.  Home Medications   Prior to Admission medications   Medication Sig Start Date End Date Taking? Authorizing Provider  ibuprofen (ADVIL,MOTRIN) 200 MG tablet Take 400 mg by mouth every 6 (six) hours as needed for moderate pain.   Yes Historical Provider, MD  omeprazole (PRILOSEC) 20 MG capsule Take 1 capsule (20 mg total) by mouth daily. 01/09/14  Yes Linwood DibblesJon Knapp, MD  tetrahydrozoline (VISINE) 0.05 % ophthalmic solution Place 2  drops into both eyes once as needed.   Yes Historical Provider, MD   BP 139/87  Pulse 55  Temp(Src) 98.8 F (37.1 C) (Oral)  Resp 20  Ht 5\' 10"  (1.778 m)  Wt 190 lb (86.183 kg)  BMI 27.26 kg/m2  SpO2 97%  Physical Exam  Constitutional: He is oriented to person, place, and time. He appears well-developed and well-nourished.  HENT:  Head: Normocephalic and atraumatic.  Eyes: Pupils are equal, round, and reactive to light.  Neck: Normal range of motion. Neck supple.  Pulmonary/Chest: Effort normal. No respiratory distress. He exhibits no tenderness.  Musculoskeletal:       Thoracic back: He exhibits tenderness and pain. He exhibits normal range of motion, no bony tenderness, no swelling, no deformity and no spasm.       Back:  Neurological: He is alert and oriented to person, place, and time. He has normal reflexes. He exhibits normal muscle tone. Coordination normal.  Skin: Skin is warm and dry. No rash noted.    ED Course  Procedures (including critical care time)  DIAGNOSTIC STUDIES: Oxygen Saturation is 97% on RA, adequate by my interpretation.    COORDINATION OF CARE: 9:15 PM- Advised pt to apply warm and cold compresses. Will prescribe muscle relaxants and pain medication. Pt advised of plan for treatment and pt agrees.    I personally performed the services described in this  documentation, which was scribed in my presence. The recorded information has been reviewed and is accurate.    Carlyle Dolly, PA-C 07/31/14 [phone]

## 2014-07-29 NOTE — ED Notes (Signed)
Pt reports hurting his mid-back today while lifting a box.

## 2014-07-31 NOTE — ED Provider Notes (Signed)
Medical screening examination/treatment/procedure(s) were performed by non-physician practitioner and as supervising physician I was immediately available for consultation/collaboration.   EKG Interpretation None       Naomia Lenderman, MD 07/31/14 1417 

## 2014-08-11 ENCOUNTER — Emergency Department (HOSPITAL_COMMUNITY)
Admission: EM | Admit: 2014-08-11 | Discharge: 2014-08-11 | Disposition: A | Discharge status: Home / Self Care | Payer: Self-pay | Attending: Emergency Medicine | Admitting: Emergency Medicine

## 2014-08-11 ENCOUNTER — Encounter (HOSPITAL_COMMUNITY): Payer: Self-pay | Admitting: Emergency Medicine

## 2014-08-11 ENCOUNTER — Emergency Department (HOSPITAL_COMMUNITY): Payer: Self-pay

## 2014-08-11 DIAGNOSIS — M129 Arthropathy, unspecified: Secondary | ICD-10-CM | POA: Insufficient documentation

## 2014-08-11 DIAGNOSIS — G8911 Acute pain due to trauma: Secondary | ICD-10-CM | POA: Insufficient documentation

## 2014-08-11 DIAGNOSIS — Z79899 Other long term (current) drug therapy: Secondary | ICD-10-CM | POA: Insufficient documentation

## 2014-08-11 DIAGNOSIS — F17200 Nicotine dependence, unspecified, uncomplicated: Secondary | ICD-10-CM | POA: Insufficient documentation

## 2014-08-11 DIAGNOSIS — M62830 Muscle spasm of back: Secondary | ICD-10-CM

## 2014-08-11 DIAGNOSIS — M545 Low back pain, unspecified: Secondary | ICD-10-CM | POA: Insufficient documentation

## 2014-08-11 DIAGNOSIS — M5380 Other specified dorsopathies, site unspecified: Secondary | ICD-10-CM | POA: Insufficient documentation

## 2014-08-11 DIAGNOSIS — IMO0002 Reserved for concepts with insufficient information to code with codable children: Secondary | ICD-10-CM | POA: Insufficient documentation

## 2014-08-11 MED ORDER — CYCLOBENZAPRINE HCL 10 MG PO TABS: 10.0000 mg | ORAL_TABLET | Freq: Three times a day (TID) | ORAL | Status: DC | PRN

## 2014-08-11 MED ORDER — MELOXICAM 15 MG PO TABS: 15.0000 mg | ORAL_TABLET | Freq: Every day | ORAL | Status: DC

## 2014-08-11 NOTE — ED Provider Notes (Signed)
Medical screening examination/treatment/procedure(s) were performed by non-physician practitioner and as supervising physician I was immediately available for consultation/collaboration.   EKG Interpretation None       Rosmery Duggin, MD 08/11/14 1722 

## 2014-08-11 NOTE — ED Notes (Signed)
Pt reports being seen here on 07/29/14 for back pain and was diagnosed with a lumbar strain. Pt states that after a few days it got a little better, however three days ago the pain began to worsen.Pt states that he does take out the trash and carries two textbooks for school, however denies any heavy lifting or re-injury.

## 2014-08-11 NOTE — ED Provider Notes (Signed)
CSN: [id number]     Arrival date & time 08/11/14  1234 History  This chart was scribed for non-physician practitioner, Arthor Captain, PA-C,working with Doug Sou, MD, by Karle Plumber, ED Scribe. This patient was seen in room WTR6/WTR6 and the patient's care was started at 1:06 PM.  Chief Complaint  Patient presents with  . Back Pain   The history is provided by the patient. No language interpreter was used.   HPI Comments:  Bobby Holmes is a 38 y.o. male who presents to the Emergency Department complaining of worsening, non-radiating lower back pain that began a couple weeks ago. Pt states he was seen here approximately two weeks ago for similar symptoms and was treated for a lumbar strain secondary to lifting and carrying a box of heavy books. He states the pain began to get better for a few days, however it is now worsening again. Pt reports the pain is 8/10. He states he has been taking the Flexeril, Prednisone, and Percocet he was prescribed as directed with some relief but is now out. He denies bowel or bladder incontinence, fever, chills or numbness. He denies any new injury, trauma or fall. He denies lifting any heavy objects recently.  Past Medical History  Diagnosis Date  . Arthritis    Past Surgical History  Procedure Laterality Date  . Knee surgery     No family history on file. History  Substance Use Topics  . Smoking status: Current Every Day Smoker    Types: Cigarettes  . Smokeless tobacco: Not on file  . Alcohol Use: No    Review of Systems  Constitutional: Negative for fever and chills.  Genitourinary: Negative for urgency, frequency, hematuria, decreased urine volume and difficulty urinating.  Musculoskeletal: Positive for back pain.  Neurological: Negative for numbness.    Allergies  Review of patient's allergies indicates no known allergies.  Home Medications   Prior to Admission medications   Medication Sig Start Date End Date Taking?  Authorizing Provider  cyclobenzaprine (FLEXERIL) 10 MG tablet Take 1 tablet (10 mg total) by mouth 3 (three) times daily as needed for muscle spasms. 07/29/14   Jamesetta Orleans Lawyer, PA-C  ibuprofen (ADVIL,MOTRIN) 200 MG tablet Take 400 mg by mouth every 6 (six) hours as needed for moderate pain.    Historical Provider, MD  omeprazole (PRILOSEC) 20 MG capsule Take 1 capsule (20 mg total) by mouth daily. 01/09/14   Linwood Dibbles, MD  oxyCODONE-acetaminophen (PERCOCET/ROXICET) 5-325 MG per tablet Take 1 tablet by mouth every 6 (six) hours as needed for severe pain. 07/29/14   Jamesetta Orleans Lawyer, PA-C  predniSONE (DELTASONE) 50 MG tablet Take 1 tablet (50 mg total) by mouth daily. 07/29/14   Jamesetta Orleans Lawyer, PA-C  tetrahydrozoline (VISINE) 0.05 % ophthalmic solution Place 2 drops into both eyes once as needed.    Historical Provider, MD   Triage Vitals: BP 128/88  Pulse 80  Temp(Src) 98.1 F (36.7 C) (Oral)  Resp 18  SpO2 99% Physical Exam  Nursing note and vitals reviewed. Constitutional: He is oriented to person, place, and time. He appears well-developed and well-nourished.  HENT:  Head: Normocephalic and atraumatic.  Eyes: EOM are normal.  Neck: Normal range of motion.  Cardiovascular: Normal rate.   Pulmonary/Chest: Effort normal.  Musculoskeletal: Normal range of motion. He exhibits tenderness. He exhibits no edema.  No midline spinal tenderness. No bony tenderness. Reproducible pain with palpation of the lumbar paraspinal muscles at about L-1 on the left side. Palpable  spasms in the erectors. Guarded with movement of the torso.  Neurological: He is alert and oriented to person, place, and time. He has normal reflexes.  No DTR abnormality. No antalgic gait.  Skin: Skin is warm and dry.  Psychiatric: He has a normal mood and affect. His behavior is normal.    ED Course  Procedures (including critical care time) DIAGNOSTIC STUDIES: Oxygen Saturation is 99% on RA, normal by my  interpretation.   COORDINATION OF CARE: 1:17 PM- Will prescribe Flexeril and an NSAID. Pt verbalizes understanding and agrees to plan.  Medications - No data to display  Labs Review Labs Reviewed - No data to display  Imaging Review No results found.   EKG Interpretation None      MDM   Final diagnoses:  Back spasm    Patient with back spasm Patient with back pain.  No neurological deficits and normal neuro exam.  Patient can walk but states is painful.  No loss of bowel or bladder control.  No concern for cauda equina.  No fever, night sweats, weight loss, h/o cancer, IVDU.  RICE protocol and pain medicine indicated and discussed with patient.    I personally performed the services described in this documentation, which was scribed in my presence. The recorded information has been reviewed and is accurate.    Arthor Captain, PA-C 08/11/14 1335

## 2014-08-11 NOTE — Discharge Instructions (Signed)
Back Injury Prevention Back injuries can be extremely painful and difficult to heal. After having one back injury, you are much more likely to experience another later on. It is important to learn how to avoid injuring or re-injuring your back. The following tips can help you to prevent a back injury. PHYSICAL FITNESS  Exercise regularly and try to develop good tone in your abdominal muscles. Your abdominal muscles provide a lot of the support needed by your back.  Do aerobic exercises (walking, jogging, biking, swimming) regularly.  Do exercises that increase balance and strength (tai chi, yoga) regularly. This can decrease your risk of falling and injuring your back.  Stretch before and after exercising.  Maintain a healthy weight. The more you weigh, the more stress is placed on your back. For every pound of weight, 10 times that amount of pressure is placed on the back. DIET  Talk to your caregiver about how much calcium and vitamin D you need per day. These nutrients help to prevent weakening of the bones (osteoporosis). Osteoporosis can cause broken (fractured) bones that lead to back pain.  Include good sources of calcium in your diet, such as dairy products, green, leafy vegetables, and products with calcium added (fortified).  Include good sources of vitamin D in your diet, such as milk and foods that are fortified with vitamin D.  Consider taking a nutritional supplement or a multivitamin if needed.  Stop smoking if you smoke. POSTURE  Sit and stand up straight. Avoid leaning forward when you sit or hunching over when you stand.  Choose chairs with good low back (lumbar) support.  If you work at a desk, sit close to your work so you do not need to lean over. Keep your chin tucked in. Keep your neck drawn back and elbows bent at a right angle. Your arms should look like the letter "L."  Sit high and close to the steering wheel when you drive. Add a lumbar support to your car  seat if needed.  Avoid sitting or standing in one position for too long. Take breaks to get up, stretch, and walk around at least once every hour. Take breaks if you are driving for long periods of time.  Sleep on your side with your knees slightly bent, or sleep on your back with a pillow under your knees. Do not sleep on your stomach. LIFTING, TWISTING, AND REACHING  Avoid heavy lifting, especially repetitive lifting. If you must do heavy lifting:  Stretch before lifting.  Work slowly.  Rest between lifts.  Use carts and dollies to move objects when possible.  Make several small trips instead of carrying 1 heavy load.  Ask for help when you need it.  Ask for help when moving big, awkward objects.  Follow these steps when lifting:  Stand with your feet shoulder-width apart.  Get as close to the object as you can. Do not try to pick up heavy objects that are far from your body.  Use handles or lifting straps if they are available.  Bend at your knees. Squat down, but keep your heels off the floor.  Keep your shoulders pulled back, your chin tucked in, and your back straight.  Lift the object slowly, tightening the muscles in your legs, abdomen, and buttocks. Keep the object as close to the center of your body as possible.  When you put a load down, use these same guidelines in reverse.  Do not:  Lift the object above your waist.  Twist at the waist while lifting or carrying a load. Move your feet if you need to turn, not your waist.  Bend over without bending at your knees.  Avoid reaching over your head, across a table, or for an object on a high surface. OTHER TIPS  Avoid wet floors and keep sidewalks clear of ice to prevent falls.  Do not sleep on a mattress that is too soft or too hard.  Keep items that are used frequently within easy reach.  Put heavier objects on shelves at waist level and lighter objects on lower or higher shelves.  Find ways to  decrease your stress, such as exercise, massage, or relaxation techniques. Stress can build up in your muscles. Tense muscles are more vulnerable to injury.  Seek treatment for depression or anxiety if needed. These conditions can increase your risk of developing back pain. SEEK MEDICAL CARE IF:  You injure your back.  You have questions about diet, exercise, or other ways to prevent back injuries. MAKE SURE YOU:  Understand these instructions.  Will watch your condition.  Will get help right away if you are not doing well or get worse. Document Released: 01/06/2005 Document Revised: 02/21/2012 Document Reviewed: 01/10/2012 [facility] Patient Information 2015 [place], [place]. This information is not intended to replace advice given to you by your health care provider. Make sure you discuss any questions you have with your health care provider.   [facility] [address], [place] [address] [phone]  [facility] [address] [place], [place] [address] [phone]

## 2015-08-05 ENCOUNTER — Emergency Department (HOSPITAL_COMMUNITY)
Admission: EM | Admit: 2015-08-05 | Discharge: 2015-08-05 | Disposition: A | Discharge status: Home / Self Care | Payer: No Typology Code available for payment source | Attending: Emergency Medicine | Admitting: Emergency Medicine

## 2015-08-05 ENCOUNTER — Encounter (HOSPITAL_COMMUNITY): Payer: Self-pay

## 2015-08-05 ENCOUNTER — Emergency Department (HOSPITAL_COMMUNITY): Payer: No Typology Code available for payment source

## 2015-08-05 DIAGNOSIS — Z720 Tobacco use: Secondary | ICD-10-CM | POA: Diagnosis not present

## 2015-08-05 DIAGNOSIS — M1990 Unspecified osteoarthritis, unspecified site: Secondary | ICD-10-CM | POA: Insufficient documentation

## 2015-08-05 DIAGNOSIS — Z79899 Other long term (current) drug therapy: Secondary | ICD-10-CM | POA: Insufficient documentation

## 2015-08-05 DIAGNOSIS — Y92410 Unspecified street and highway as the place of occurrence of the external cause: Secondary | ICD-10-CM | POA: Diagnosis not present

## 2015-08-05 DIAGNOSIS — S8991XA Unspecified injury of right lower leg, initial encounter: Secondary | ICD-10-CM | POA: Insufficient documentation

## 2015-08-05 DIAGNOSIS — Z7952 Long term (current) use of systemic steroids: Secondary | ICD-10-CM | POA: Insufficient documentation

## 2015-08-05 DIAGNOSIS — S299XXA Unspecified injury of thorax, initial encounter: Secondary | ICD-10-CM | POA: Diagnosis not present

## 2015-08-05 DIAGNOSIS — R0789 Other chest pain: Secondary | ICD-10-CM

## 2015-08-05 DIAGNOSIS — Y998 Other external cause status: Secondary | ICD-10-CM | POA: Diagnosis not present

## 2015-08-05 DIAGNOSIS — Z791 Long term (current) use of non-steroidal anti-inflammatories (NSAID): Secondary | ICD-10-CM | POA: Diagnosis not present

## 2015-08-05 DIAGNOSIS — Y9389 Activity, other specified: Secondary | ICD-10-CM | POA: Diagnosis not present

## 2015-08-05 DIAGNOSIS — M25561 Pain in right knee: Secondary | ICD-10-CM

## 2015-08-05 MED ORDER — METHOCARBAMOL 500 MG PO TABS
500.0000 mg | ORAL_TABLET | Freq: Two times a day (BID) | ORAL | Status: DC
Start: 2015-08-05 — End: 2016-05-26

## 2015-08-05 MED ORDER — NAPROXEN 500 MG PO TABS: 500.0000 mg | ORAL_TABLET | Freq: Two times a day (BID) | ORAL | Status: DC
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
## 2015-10-29 ENCOUNTER — Emergency Department (HOSPITAL_COMMUNITY)
Admission: EM | Admit: 2015-10-29 | Discharge: 2015-10-29 | Disposition: A | Discharge status: Home / Self Care | Payer: Self-pay | Attending: Emergency Medicine | Admitting: Emergency Medicine

## 2015-10-29 ENCOUNTER — Encounter (HOSPITAL_COMMUNITY): Payer: Self-pay | Admitting: Emergency Medicine

## 2015-10-29 DIAGNOSIS — M1990 Unspecified osteoarthritis, unspecified site: Secondary | ICD-10-CM | POA: Insufficient documentation

## 2015-10-29 DIAGNOSIS — F17210 Nicotine dependence, cigarettes, uncomplicated: Secondary | ICD-10-CM | POA: Insufficient documentation

## 2015-10-29 DIAGNOSIS — Z79899 Other long term (current) drug therapy: Secondary | ICD-10-CM | POA: Insufficient documentation

## 2015-10-29 DIAGNOSIS — Z7952 Long term (current) use of systemic steroids: Secondary | ICD-10-CM | POA: Insufficient documentation

## 2015-10-29 DIAGNOSIS — Z791 Long term (current) use of non-steroidal anti-inflammatories (NSAID): Secondary | ICD-10-CM | POA: Insufficient documentation

## 2015-10-29 DIAGNOSIS — J020 Streptococcal pharyngitis: Secondary | ICD-10-CM | POA: Insufficient documentation

## 2015-10-29 LAB — RAPID STREP SCREEN (MED CTR MEBANE ONLY): STREPTOCOCCUS, GROUP A SCREEN (DIRECT): POSITIVE — AB

## 2015-10-29 MED ORDER — PENICILLIN G BENZATHINE 1200000 UNIT/2ML IM SUSP
1.2000 10*6.[IU] | Freq: Once | INTRAMUSCULAR | Status: AC
  Administered 2015-10-29: 1.2 10*6.[IU] via INTRAMUSCULAR
  Filled 2015-10-29: qty 2

## 2015-10-29 NOTE — ED Notes (Signed)
Pt states that he has had a fever and a sore throat with white patches x 3 days. Alert and oriented. Took ibuprofen at 6pm.

## 2015-10-29 NOTE — Discharge Instructions (Signed)
You have been treated for strep throat. Follow up with Dr. Emeline DarlingGore as needed. Refer to attached documents for more information.

## 2015-10-29 NOTE — ED Notes (Signed)
Attempted to call patient, no answer 

## 2015-10-29 NOTE — ED Provider Notes (Signed)
CSN: [id number]     Arrival date & time 10/29/15  2029 History  By signing my name below, I, Budd PalmerVanessa Prueter, attest that this documentation has been prepared under the direction and in the presence of AK Steel Holding CorporationKaitlyn Shalene Gallen, PA-C. Electronically Signed: Budd PalmerVanessa Prueter, ED Scribe. 10/29/2015. 9:18 PM.    Chief Complaint  Patient presents with  . Sore Throat   Patient is a 39 y.o. male presenting with pharyngitis. The history is provided by the patient and the spouse. No language interpreter was used.  Sore Throat This is a new problem. The current episode started more than 2 days ago. The problem occurs constantly. Associated symptoms include headaches. Nothing relieves the symptoms. He has tried acetaminophen for the symptoms.   HPI Comments: Bobby StageGregory Holmes is a 39 y.o. male smoker at 0.35 ppd who presents to the Emergency Department complaining of constant, worsening sore throat onset 3 days ago. He reports associated subjective fever, chills, HA, mild cough, and congestion. Per wife, pt had "little white spots" in the back of his throat as well. He states he has taken ibuprofen, alka seltzer, and Nyquil at home with mild relief. He denies any recent sick contacts.  Past Medical History  Diagnosis Date  . Arthritis    Past Surgical History  Procedure Laterality Date  . Knee surgery     Family History  Problem Relation Age of Onset  . Cirrhosis Father    Social History  Substance Use Topics  . Smoking status: Current Every Day Smoker -- 0.35 packs/day    Types: Cigarettes  . Smokeless tobacco: None  . Alcohol Use: No    Review of Systems  Constitutional: Positive for fever and chills.  HENT: Positive for congestion and sore throat.   Respiratory: Positive for cough.   Neurological: Positive for headaches.  All other systems reviewed and are negative.   Allergies  Review of patient's allergies indicates no known allergies.  Home Medications   Prior to Admission medications    Medication Sig Start Date End Date Taking? Authorizing Provider  cyclobenzaprine (FLEXERIL) 10 MG tablet Take 1 tablet (10 mg total) by mouth 3 (three) times daily as needed for muscle spasms. 08/11/14   Arthor CaptainAbigail Harris, PA-C  ibuprofen (ADVIL,MOTRIN) 200 MG tablet Take 400 mg by mouth every 6 (six) hours as needed for moderate pain.    Historical Provider, MD  meloxicam (MOBIC) 15 MG tablet Take 1 tablet (15 mg total) by mouth daily. Take 1 daily with food. 08/11/14   Arthor CaptainAbigail Harris, PA-C  methocarbamol (ROBAXIN) 500 MG tablet Take 1 tablet (500 mg total) by mouth 2 (two) times daily. 08/05/15   Garlon HatchetLisa M Sanders, PA-C  naproxen (NAPROSYN) 500 MG tablet Take 1 tablet (500 mg total) by mouth 2 (two) times daily with a meal. 08/05/15   Garlon HatchetLisa M Sanders, PA-C  omeprazole (PRILOSEC) 20 MG capsule Take 1 capsule (20 mg total) by mouth daily. 01/09/14   Linwood DibblesJon Knapp, MD  oxyCODONE-acetaminophen (PERCOCET/ROXICET) 5-325 MG per tablet Take 1 tablet by mouth every 6 (six) hours as needed for severe pain. 07/29/14   Charlestine Nighthristopher Lawyer, PA-C  predniSONE (DELTASONE) 50 MG tablet Take 1 tablet (50 mg total) by mouth daily. 07/29/14   Charlestine Nighthristopher Lawyer, PA-C  tetrahydrozoline (VISINE) 0.05 % ophthalmic solution Place 2 drops into both eyes once as needed.    Historical Provider, MD   BP 135/78 mmHg  Pulse 62  Temp(Src) 99.2 F (37.3 C) (Oral)  Resp 18  SpO2 100% Physical Exam  Constitutional: He appears well-developed and well-nourished. No distress.  HENT:  Head: Normocephalic and atraumatic.  Mouth/Throat: Oropharyngeal exudate present.  Bilateral tonsillar edema, erythema, and exudate.   Eyes: Conjunctivae are normal. Right eye exhibits no discharge. Left eye exhibits no discharge.  Neck: Normal range of motion.  Cardiovascular: Normal rate and regular rhythm.  Exam reveals no gallop and no friction rub.   No murmur heard. Pulmonary/Chest: Effort normal and breath sounds normal. No respiratory distress. He has  no wheezes. He has no rales. He exhibits no tenderness.  Abdominal: Soft. He exhibits no distension. There is no tenderness.  Musculoskeletal: Normal range of motion.  Lymphadenopathy:    He has cervical adenopathy.  Neurological: He is alert. Coordination normal.  Speech is goal-oriented. Moves limbs without ataxia.   Skin: Skin is warm and dry. No rash noted. He is not diaphoretic. No erythema.  Psychiatric: He has a normal mood and affect. His behavior is normal.  Nursing note and vitals reviewed.   ED Course  Procedures  DIAGNOSTIC STUDIES: Oxygen Saturation is 100% on RA, normal by my interpretation.    COORDINATION OF CARE: 9:11 PM - Discussed plans to wait on strep test. Will order antibiotics. Pt advised of plan for treatment and pt agrees.  Labs Review Labs Reviewed  RAPID STREP SCREEN (NOT AT [facility]) - Abnormal; Notable for the following:    Streptococcus, Group A Screen (Direct) POSITIVE (*)    All other components within normal limits    Imaging Review No results found. I have personally reviewed and evaluated these images and lab results as part of my medical decision-making.   EKG Interpretation None      MDM   Final diagnoses:  Strep throat    9:29 PM Patient has strep throat and will be treated with IM bicillin. Vitals stable and patient afebrile.   I personally performed the services described in this documentation, which was scribed in my presence. The recorded information has been reviewed and is accurate.   Emilia Beck, PA-C 10/29/15 2130  Benjiman Core, MD 10/30/15 0020

## 2015-11-06 ENCOUNTER — Emergency Department (HOSPITAL_COMMUNITY): Payer: Self-pay

## 2015-11-06 ENCOUNTER — Encounter (HOSPITAL_COMMUNITY): Payer: Self-pay | Admitting: Emergency Medicine

## 2015-11-06 ENCOUNTER — Emergency Department (HOSPITAL_COMMUNITY)
Admission: EM | Admit: 2015-11-06 | Discharge: 2015-11-06 | Disposition: A | Discharge status: Home / Self Care | Payer: Self-pay | Attending: Emergency Medicine | Admitting: Emergency Medicine

## 2015-11-06 DIAGNOSIS — R059 Cough, unspecified: Secondary | ICD-10-CM

## 2015-11-06 DIAGNOSIS — Z79899 Other long term (current) drug therapy: Secondary | ICD-10-CM | POA: Insufficient documentation

## 2015-11-06 DIAGNOSIS — F17210 Nicotine dependence, cigarettes, uncomplicated: Secondary | ICD-10-CM | POA: Insufficient documentation

## 2015-11-06 DIAGNOSIS — M1990 Unspecified osteoarthritis, unspecified site: Secondary | ICD-10-CM | POA: Insufficient documentation

## 2015-11-06 DIAGNOSIS — R05 Cough: Secondary | ICD-10-CM

## 2015-11-06 DIAGNOSIS — J0110 Acute frontal sinusitis, unspecified: Secondary | ICD-10-CM | POA: Insufficient documentation

## 2015-11-06 MED ORDER — IPRATROPIUM-ALBUTEROL 0.5-2.5 (3) MG/3ML IN SOLN
3.0000 mL | Freq: Once | RESPIRATORY_TRACT | Status: AC
  Administered 2015-11-06: 3 mL via RESPIRATORY_TRACT
  Filled 2015-11-06: qty 3

## 2015-11-06 MED ORDER — AMOXICILLIN-POT CLAVULANATE 875-125 MG PO TABS: 1.0000 | ORAL_TABLET | Freq: Two times a day (BID) | ORAL | Status: DC

## 2015-11-06 NOTE — Discharge Instructions (Signed)
Please read attached information. If you experience any new or worsening signs or symptoms please return to the emergency room for evaluation. Please follow-up with your primary care provider or specialist as discussed. Please use medication prescribed only as directed and discontinue taking if you have any concerning signs or symptoms.   °

## 2015-11-06 NOTE — ED Notes (Signed)
Jari Favrescar RN made aware of pt being chest pain and needing ekg

## 2015-11-06 NOTE — ED Provider Notes (Signed)
CSN: [id number]     Arrival date & time 11/06/15  1726 History   First MD Initiated Contact with Patient 11/06/15 1809     No chief complaint on file.  HPI   39 year old male presents today with numerous complaints. Patient reports that he was seen on 10/29/2015 for sore throat, diagnosed with strep pharyngitis, treated with IM penicillin. He reports sore throat improved, but developed rhinorrhea, sinus pressure and pain with associated headache. He reports continuation of mild cough, report is nonproductive. Patient reports originally he had a fever with history of pharyngitis, but after improvement of symptoms he was no longer febrile. Patient denies any chest pain, shortness of breath, productive cough, abdominal pain. Patient reports chest "tightness" that is associated with coughing.   Past Medical History  Diagnosis Date  . Arthritis    Past Surgical History  Procedure Laterality Date  . Knee surgery     Family History  Problem Relation Age of Onset  . Cirrhosis Father    Social History  Substance Use Topics  . Smoking status: Current Every Day Smoker -- 0.35 packs/day    Types: Cigarettes  . Smokeless tobacco: None  . Alcohol Use: No    Review of Systems  All other systems reviewed and are negative.   Allergies  Review of patient's allergies indicates no known allergies.  Home Medications   Prior to Admission medications   Medication Sig Start Date End Date Taking? Authorizing Provider  amoxicillin-clavulanate (AUGMENTIN) 875-125 MG tablet Take 1 tablet by mouth 2 (two) times daily. 11/06/15   Eyvonne Mechanic, PA-C  cyclobenzaprine (FLEXERIL) 10 MG tablet Take 1 tablet (10 mg total) by mouth 3 (three) times daily as needed for muscle spasms. Patient not taking: Reported on 10/29/2015 08/11/14   Arthor Captain, PA-C  ibuprofen (ADVIL,MOTRIN) 200 MG tablet Take 400 mg by mouth every 6 (six) hours as needed for moderate pain.    Historical Provider, MD  meloxicam  (MOBIC) 15 MG tablet Take 1 tablet (15 mg total) by mouth daily. Take 1 daily with food. Patient not taking: Reported on 10/29/2015 08/11/14   Arthor Captain, PA-C  methocarbamol (ROBAXIN) 500 MG tablet Take 1 tablet (500 mg total) by mouth 2 (two) times daily. Patient not taking: Reported on 10/29/2015 08/05/15   Garlon Hatchet, PA-C  naproxen (NAPROSYN) 500 MG tablet Take 1 tablet (500 mg total) by mouth 2 (two) times daily with a meal. Patient not taking: Reported on 10/29/2015 08/05/15   Garlon Hatchet, PA-C  omeprazole (PRILOSEC) 20 MG capsule Take 1 capsule (20 mg total) by mouth daily. Patient not taking: Reported on 10/29/2015 01/09/14   Linwood Dibbles, MD  oxyCODONE-acetaminophen (PERCOCET/ROXICET) 5-325 MG per tablet Take 1 tablet by mouth every 6 (six) hours as needed for severe pain. Patient not taking: Reported on 10/29/2015 07/29/14   Charlestine Night, PA-C  phenol (CHLORASEPTIC) 1.4 % LIQD Use as directed 1 spray in the mouth or throat as needed for throat irritation / pain.    Historical Provider, MD  Phenyleph-Doxylamine-DM-APAP (ALKA SELTZER PLUS PO) Take 1 each by mouth daily.    Historical Provider, MD  Phenyleph-Doxylamine-DM-APAP (NYQUIL SEVERE COLD/FLU) 5-6.25-10-325 MG/15ML LIQD Take 30 mLs by mouth at bedtime.    Historical Provider, MD  predniSONE (DELTASONE) 50 MG tablet Take 1 tablet (50 mg total) by mouth daily. Patient not taking: Reported on 10/29/2015 07/29/14   Charlestine Night, PA-C   BP 137/83 mmHg  Pulse 81  Temp(Src) 99.4 F (37.4 C) (  Oral)  Resp 17  SpO2 98%   Physical Exam  Constitutional: He is oriented to person, place, and time. He appears well-developed and well-nourished.  HENT:  Head: Normocephalic and atraumatic.  Nose: Rhinorrhea present. No mucosal edema. Right sinus exhibits frontal sinus tenderness. Left sinus exhibits frontal sinus tenderness.  Eyes: Conjunctivae are normal. Pupils are equal, round, and reactive to light. Right eye exhibits no  discharge. Left eye exhibits no discharge. No scleral icterus.  Neck: Normal range of motion. No JVD present. No tracheal deviation present.  Cardiovascular: Normal rate, regular rhythm, normal heart sounds and intact distal pulses.  Exam reveals no gallop and no friction rub.   No murmur heard. Pulmonary/Chest: Effort normal and breath sounds normal. No stridor. No respiratory distress. He has no wheezes. He has no rales. He exhibits no tenderness.  Abdominal: Soft. He exhibits no distension.  Musculoskeletal: Normal range of motion. He exhibits no edema or tenderness.  No lower swelling or edema  Neurological: He is alert and oriented to person, place, and time. Coordination normal.  Skin: Skin is warm and dry. No rash noted. No erythema. No pallor.  Psychiatric: He has a normal mood and affect. His behavior is normal. Judgment and thought content normal.  Nursing note and vitals reviewed.   ED Course  Procedures (including critical care time) Labs Review Labs Reviewed - No data to display  Imaging Review Dg Chest 2 View  11/06/2015  CLINICAL DATA:  Fever and cough for 1 week.  Chest pain for 3 days EXAM: CHEST  2 VIEW COMPARISON:  August 05, 2015 FINDINGS: Lungs are clear. Heart size and pulmonary vascularity are normal. No adenopathy. No pneumothorax. No bone lesions. IMPRESSION: No abnormality noted. Electronically Signed   By: Bretta BangWilliam  Woodruff III M.D.   On: 11/06/2015 19:03   I have personally reviewed and evaluated these images and lab results as part of my medical decision-making.   EKG Interpretation   Date/Time:  Thursday November 06 2015 18:02:55 EST Ventricular Rate:  78 PR Interval:  171 QRS Duration: 95 QT Interval:  398 QTC Calculation: 453 R Axis:   52 Text Interpretation:  Sinus rhythm S1,S2,S3 pattern Anteroseptal infarct,  old Minimal ST elevation, lateral leads Baseline wander in lead(s) II III  aVL aVF V1 V2 V3 V4 V5 V6 No previous tracing Confirmed by  Anitra LauthPLUNKETT  MD,  Alphonzo LemmingsWHITNEY ([id number]) on 11/06/2015 6:14:32 PM      MDM   Final diagnoses:  Acute frontal sinusitis, recurrence not specified  Cough    Labs:  Imaging: DG chest 2 view shows no abnormality  Consults:  Therapeutics:  Discharge Meds:   Assessment/Plan: Patient presents with likely sinusitis. Patient is afebrile, nontoxic in no acute distress. Patient is in no respiratory distress, with normal chest x-ray. Patient will be prescribed Augmentin for his sinusitis, encouraged follow-up with his primary care provider in 3 days for reevaluation. Patient is given strict return precautions, he verbalizes understanding and agreement today's plan and had no further questions or concerns at the time of discharge. headache is likely due to the sinusitis as it is frontal, with no red flags for headache.        Eyvonne MechanicJeffrey Jazper Nikolai, PA-C 11/07/15 [phone]  Gwyneth SproutWhitney Plunkett, MD 11/08/15 0000

## 2015-11-06 NOTE — ED Notes (Signed)
Pt c/o posterior headache, chest pain, body aches, fever up to 103 F, pt states he was treated for strep throat last week.

## 2015-11-06 NOTE — ED Notes (Signed)
Patient transported to X-ray 

## 2016-01-29 ENCOUNTER — Emergency Department (HOSPITAL_COMMUNITY): Payer: No Typology Code available for payment source

## 2016-01-29 ENCOUNTER — Inpatient Hospital Stay (HOSPITAL_COMMUNITY): Payer: No Typology Code available for payment source | Admitting: Anesthesiology

## 2016-01-29 ENCOUNTER — Encounter (HOSPITAL_COMMUNITY): Payer: Self-pay | Admitting: *Deleted

## 2016-01-29 ENCOUNTER — Inpatient Hospital Stay (HOSPITAL_COMMUNITY): Payer: No Typology Code available for payment source

## 2016-01-29 ENCOUNTER — Inpatient Hospital Stay (HOSPITAL_COMMUNITY)
Admission: EM | Admit: 2016-01-29 | Discharge: 2016-02-03 | DRG: 011 | Disposition: A | Discharge status: Home / Self Care | Payer: No Typology Code available for payment source | Attending: General Surgery | Admitting: General Surgery

## 2016-01-29 ENCOUNTER — Encounter (HOSPITAL_COMMUNITY): Admission: EM | Disposition: A | Discharge status: Home / Self Care | Payer: Self-pay | Source: Home / Self Care

## 2016-01-29 DIAGNOSIS — S02602B Fracture of unspecified part of body of left mandible, initial encounter for open fracture: Principal | ICD-10-CM | POA: Diagnosis present

## 2016-01-29 DIAGNOSIS — R6884 Jaw pain: Secondary | ICD-10-CM | POA: Diagnosis present

## 2016-01-29 DIAGNOSIS — T148XXA Other injury of unspecified body region, initial encounter: Secondary | ICD-10-CM

## 2016-01-29 DIAGNOSIS — E781 Pure hyperglyceridemia: Secondary | ICD-10-CM | POA: Diagnosis present

## 2016-01-29 DIAGNOSIS — Y249XXA Unspecified firearm discharge, undetermined intent, initial encounter: Secondary | ICD-10-CM

## 2016-01-29 DIAGNOSIS — Y92810 Car as the place of occurrence of the external cause: Secondary | ICD-10-CM | POA: Diagnosis not present

## 2016-01-29 DIAGNOSIS — S21139A Puncture wound without foreign body of unspecified front wall of thorax without penetration into thoracic cavity, initial encounter: Secondary | ICD-10-CM

## 2016-01-29 DIAGNOSIS — J9600 Acute respiratory failure, unspecified whether with hypoxia or hypercapnia: Secondary | ICD-10-CM | POA: Diagnosis present

## 2016-01-29 DIAGNOSIS — D62 Acute posthemorrhagic anemia: Secondary | ICD-10-CM | POA: Diagnosis present

## 2016-01-29 DIAGNOSIS — J9382 Other air leak: Secondary | ICD-10-CM | POA: Diagnosis present

## 2016-01-29 DIAGNOSIS — S0123XA Puncture wound without foreign body of nose, initial encounter: Secondary | ICD-10-CM | POA: Diagnosis present

## 2016-01-29 DIAGNOSIS — S41132A Puncture wound without foreign body of left upper arm, initial encounter: Secondary | ICD-10-CM | POA: Diagnosis present

## 2016-01-29 DIAGNOSIS — S02600B Fracture of unspecified part of body of mandible, initial encounter for open fracture: Secondary | ICD-10-CM

## 2016-01-29 DIAGNOSIS — S0240DB Maxillary fracture, left side, initial encounter for open fracture: Secondary | ICD-10-CM | POA: Diagnosis present

## 2016-01-29 DIAGNOSIS — S02609D Fracture of mandible, unspecified, subsequent encounter for fracture with routine healing: Secondary | ICD-10-CM

## 2016-01-29 DIAGNOSIS — S21132A Puncture wound without foreign body of left front wall of thorax without penetration into thoracic cavity, initial encounter: Secondary | ICD-10-CM | POA: Diagnosis present

## 2016-01-29 DIAGNOSIS — T1490XA Injury, unspecified, initial encounter: Secondary | ICD-10-CM

## 2016-01-29 DIAGNOSIS — S0183XA Puncture wound without foreign body of other part of head, initial encounter: Secondary | ICD-10-CM

## 2016-01-29 DIAGNOSIS — K219 Gastro-esophageal reflux disease without esophagitis: Secondary | ICD-10-CM | POA: Diagnosis present

## 2016-01-29 DIAGNOSIS — W3400XA Accidental discharge from unspecified firearms or gun, initial encounter: Secondary | ICD-10-CM

## 2016-01-29 DIAGNOSIS — Z978 Presence of other specified devices: Secondary | ICD-10-CM

## 2016-01-29 HISTORY — PX: ORIF MANDIBULAR FRACTURE: SHX2127

## 2016-01-29 HISTORY — PX: TRACHEOSTOMY TUBE PLACEMENT: SHX814

## 2016-01-29 HISTORY — PX: NASAL-PHARYNGEAL APPLICATOR INSERTION: SHX5343

## 2016-01-29 LAB — CBC
HEMATOCRIT: 46 % (ref 39.0–52.0)
HEMATOCRIT: 42 % (ref 39.0–52.0)
HEMOGLOBIN: 15.7 g/dL (ref 13.0–17.0)
HEMOGLOBIN: 13.8 g/dL (ref 13.0–17.0)
MCH: 32.4 pg (ref 26.0–34.0)
MCH: 31.7 pg (ref 26.0–34.0)
MCHC: 34.1 g/dL (ref 30.0–36.0)
MCHC: 32.9 g/dL (ref 30.0–36.0)
MCV: 95 fL (ref 78.0–100.0)
MCV: 96.3 fL (ref 78.0–100.0)
Platelets: 282 10*3/uL (ref 150–400)
Platelets: 248 10*3/uL (ref 150–400)
RBC: 4.84 MIL/uL (ref 4.22–5.81)
RBC: 4.36 MIL/uL (ref 4.22–5.81)
RDW: 13.1 % (ref 11.5–15.5)
RDW: 13.2 % (ref 11.5–15.5)
WBC: 8.9 10*3/uL (ref 4.0–10.5)
WBC: 18 10*3/uL — ABNORMAL HIGH (ref 4.0–10.5)

## 2016-01-29 LAB — I-STAT ARTERIAL BLOOD GAS, ED
ACID-BASE DEFICIT: 7 mmol/L — AB (ref 0.0–2.0)
Acid-base deficit: 7 mmol/L — ABNORMAL HIGH (ref 0.0–2.0)
Bicarbonate: 22.2 mEq/L (ref 20.0–24.0)
Bicarbonate: 19.5 mEq/L — ABNORMAL LOW (ref 20.0–24.0)
O2 SAT: 100 %
O2 SAT: 98 %
PCO2 ART: 57.9 mmHg — AB (ref 35.0–45.0)
PH ART: 7.191 — AB (ref 7.350–7.450)
PO2 ART: 116 mmHg — AB (ref 80.0–100.0)
Patient temperature: 98.6
TCO2: 24 mmol/L (ref 0–100)
TCO2: 21 mmol/L (ref 0–100)
pCO2 arterial: 40.1 mmHg (ref 35.0–45.0)
pH, Arterial: 7.296 — ABNORMAL LOW (ref 7.350–7.450)
pO2, Arterial: 383 mmHg — ABNORMAL HIGH (ref 80.0–100.0)

## 2016-01-29 LAB — COMPREHENSIVE METABOLIC PANEL
ALBUMIN: 4.2 g/dL (ref 3.5–5.0)
ALT: 44 U/L (ref 17–63)
ANION GAP: 17 — AB (ref 5–15)
AST: 42 U/L — ABNORMAL HIGH (ref 15–41)
Alkaline Phosphatase: 56 U/L (ref 38–126)
BUN: 7 mg/dL (ref 6–20)
CO2: 21 mmol/L — AB (ref 22–32)
Calcium: 9.4 mg/dL (ref 8.9–10.3)
Chloride: 104 mmol/L (ref 101–111)
Creatinine, Ser: 0.96 mg/dL (ref 0.61–1.24)
GFR calc Af Amer: >60 mL/min (ref >60)
GFR calc non Af Amer: >60 mL/min (ref >60)
GLUCOSE: 131 mg/dL — AB (ref 65–99)
POTASSIUM: 3.5 mmol/L (ref 3.5–5.1)
SODIUM: 142 mmol/L (ref 135–145)
Total Bilirubin: 0.4 mg/dL (ref 0.3–1.2)
Total Protein: 7.7 g/dL (ref 6.5–8.1)

## 2016-01-29 LAB — URINALYSIS, ROUTINE W REFLEX MICROSCOPIC
Bilirubin Urine: NEGATIVE
Glucose, UA: NEGATIVE mg/dL
KETONES UR: NEGATIVE mg/dL
LEUKOCYTES UA: NEGATIVE
NITRITE: NEGATIVE
PH: 5 (ref 5.0–8.0)
Protein, ur: NEGATIVE mg/dL
SPECIFIC GRAVITY, URINE: 1.024 (ref 1.005–1.030)

## 2016-01-29 LAB — TYPE AND SCREEN
ABO/RH(D): O POS
Antibody Screen: NEGATIVE
UNIT DIVISION: 0
Unit division: 0

## 2016-01-29 LAB — URINE MICROSCOPIC-ADD ON: WBC UA: NONE SEEN WBC/hpf (ref 0–5)

## 2016-01-29 LAB — BLOOD PRODUCT ORDER (VERBAL) VERIFICATION

## 2016-01-29 LAB — BASIC METABOLIC PANEL
Anion gap: 16 — ABNORMAL HIGH (ref 5–15)
BUN: 8 mg/dL (ref 6–20)
CHLORIDE: 108 mmol/L (ref 101–111)
CO2: 18 mmol/L — AB (ref 22–32)
CREATININE: 1.02 mg/dL (ref 0.61–1.24)
Calcium: 8.5 mg/dL — ABNORMAL LOW (ref 8.9–10.3)
GFR calc Af Amer: >60 mL/min (ref >60)
GFR calc non Af Amer: >60 mL/min (ref >60)
Glucose, Bld: 109 mg/dL — ABNORMAL HIGH (ref 65–99)
Potassium: 4.7 mmol/L (ref 3.5–5.1)
Sodium: 142 mmol/L (ref 135–145)

## 2016-01-29 LAB — PREPARE FRESH FROZEN PLASMA
UNIT DIVISION: 0
UNIT DIVISION: 0

## 2016-01-29 LAB — TRIGLYCERIDES: TRIGLYCERIDES: 432 mg/dL — AB (ref <150)

## 2016-01-29 LAB — ETHANOL: Alcohol, Ethyl (B): 263 mg/dL — ABNORMAL HIGH (ref <5)

## 2016-01-29 LAB — ABO/RH: ABO/RH(D): O POS

## 2016-01-29 LAB — PROTIME-INR
INR: 0.94 (ref 0.00–1.49)
PROTHROMBIN TIME: 12.7 s (ref 11.6–15.2)

## 2016-01-29 SURGERY — CREATION, TRACHEOSTOMY
Anesthesia: General | Site: Nose

## 2016-01-29 MED ORDER — POTASSIUM CHLORIDE IN NACL 20-0.9 MEQ/L-% IV SOLN
INTRAVENOUS | Status: DC
  Filled 2016-01-29 (×2): qty 1000

## 2016-01-29 MED ORDER — BACIT-POLY-NEO HC 1 % EX OINT
TOPICAL_OINTMENT | CUTANEOUS | Status: AC
Start: 2016-01-29 — End: 2016-01-29
  Filled 2016-01-29: qty 15

## 2016-01-29 MED ORDER — FENTANYL CITRATE (PF) 250 MCG/5ML IJ SOLN
INTRAMUSCULAR | Status: DC | PRN
  Administered 2016-01-29: 100 ug via INTRAVENOUS
  Administered 2016-01-29: 50 ug via INTRAVENOUS
  Administered 2016-01-29: 100 ug via INTRAVENOUS

## 2016-01-29 MED ORDER — MIDAZOLAM HCL 2 MG/2ML IJ SOLN
INTRAMUSCULAR | Status: AC
  Filled 2016-01-29: qty 2

## 2016-01-29 MED ORDER — GELATIN ADSORBABLE OP FILM
ORAL_FILM | OPHTHALMIC | Status: AC
  Filled 2016-01-29: qty 1

## 2016-01-29 MED ORDER — ANTISEPTIC ORAL RINSE SOLUTION (CORINZ)
7.0000 mL | Freq: Four times a day (QID) | OROMUCOSAL | Status: DC
  Administered 2016-01-29 – 2016-02-03 (×18): 7 mL via OROMUCOSAL

## 2016-01-29 MED ORDER — SUCCINYLCHOLINE CHLORIDE 20 MG/ML IJ SOLN
INTRAMUSCULAR | Status: AC | PRN
  Administered 2016-01-29: 100 mg via INTRAVENOUS

## 2016-01-29 MED ORDER — ONDANSETRON HCL 4 MG/2ML IJ SOLN
INTRAMUSCULAR | Status: AC
  Filled 2016-01-29: qty 2

## 2016-01-29 MED ORDER — SODIUM CHLORIDE 0.9 % IV SOLN
25.0000 ug/h | INTRAVENOUS | Status: DC
  Administered 2016-01-29 (×2): 400 ug/h via INTRAVENOUS
  Administered 2016-01-29: 50 ug/h via INTRAVENOUS
  Filled 2016-01-29 (×3): qty 50

## 2016-01-29 MED ORDER — DEXTROSE 5 % IV SOLN: 3.0000 g | INTRAVENOUS | Status: DC

## 2016-01-29 MED ORDER — BACITRACIN ZINC 500 UNIT/GM EX OINT
TOPICAL_OINTMENT | CUTANEOUS | Status: AC
  Filled 2016-01-29: qty 28.35

## 2016-01-29 MED ORDER — ENOXAPARIN SODIUM 40 MG/0.4ML ~~LOC~~ SOLN: 40.0000 mg | SUBCUTANEOUS | Status: DC

## 2016-01-29 MED ORDER — HEPARIN SODIUM (PORCINE) 5000 UNIT/ML IJ SOLN
5000.0000 [IU] | Freq: Three times a day (TID) | INTRAMUSCULAR | Status: DC
  Administered 2016-01-29 – 2016-01-30 (×2): 5000 [IU] via SUBCUTANEOUS
  Filled 2016-01-29 (×2): qty 1

## 2016-01-29 MED ORDER — ONDANSETRON HCL 4 MG/2ML IJ SOLN
4.0000 mg | Freq: Four times a day (QID) | INTRAMUSCULAR | Status: DC | PRN
  Administered 2016-01-29: 4 mg via INTRAVENOUS

## 2016-01-29 MED ORDER — PROPOFOL 1000 MG/100ML IV EMUL
0.0000 ug/kg/min | INTRAVENOUS | Status: DC
  Administered 2016-01-29: 50 ug/kg/min via INTRAVENOUS
  Filled 2016-01-29: qty 100

## 2016-01-29 MED ORDER — ETOMIDATE 2 MG/ML IV SOLN
INTRAVENOUS | Status: AC | PRN
  Administered 2016-01-29: 20 mg via INTRAVENOUS

## 2016-01-29 MED ORDER — IOHEXOL 350 MG/ML SOLN: 50.0000 mL | Freq: Once | INTRAVENOUS | Status: DC | PRN

## 2016-01-29 MED ORDER — MIDAZOLAM HCL 2 MG/2ML IJ SOLN
2.0000 mg | INTRAMUSCULAR | Status: AC | PRN
  Administered 2016-01-29 (×3): 2 mg via INTRAVENOUS
  Filled 2016-01-29 (×3): qty 2

## 2016-01-29 MED ORDER — NEOMYCIN-POLYMYXIN-HC 1 % OT SOLN
4.0000 [drp] | OTIC | Status: DC
  Filled 2016-01-29: qty 10

## 2016-01-29 MED ORDER — DEXAMETHASONE SODIUM PHOSPHATE 10 MG/ML IJ SOLN
10.0000 mg | Freq: Once | INTRAMUSCULAR | Status: AC
  Administered 2016-01-29: 10 mg via INTRAVENOUS
  Filled 2016-01-29: qty 1

## 2016-01-29 MED ORDER — PROPOFOL 10 MG/ML IV BOLUS
INTRAVENOUS | Status: AC | PRN
  Administered 2016-01-29: 2936.5 ug via INTRAVENOUS

## 2016-01-29 MED ORDER — PANTOPRAZOLE SODIUM 40 MG PO TBEC: 40.0000 mg | DELAYED_RELEASE_TABLET | Freq: Every day | ORAL | Status: DC

## 2016-01-29 MED ORDER — MORPHINE SULFATE (PF) 2 MG/ML IV SOLN: 2.0000 mg | INTRAVENOUS | Status: DC | PRN

## 2016-01-29 MED ORDER — OXYCODONE HCL 5 MG/5ML PO SOLN
5.0000 mg | ORAL | Status: DC | PRN
Start: 2016-01-29 — End: 2016-01-30

## 2016-01-29 MED ORDER — ROCURONIUM BROMIDE 50 MG/5ML IV SOLN
INTRAVENOUS | Status: AC
  Filled 2016-01-29: qty 3

## 2016-01-29 MED ORDER — OXYMETAZOLINE HCL 0.05 % NA SOLN
NASAL | Status: AC
  Filled 2016-01-29: qty 15

## 2016-01-29 MED ORDER — ROCURONIUM BROMIDE 100 MG/10ML IV SOLN
INTRAVENOUS | Status: DC | PRN
  Administered 2016-01-29: 50 mg via INTRAVENOUS

## 2016-01-29 MED ORDER — MIDAZOLAM HCL 2 MG/2ML IJ SOLN
2.0000 mg | INTRAMUSCULAR | Status: DC | PRN
  Administered 2016-01-29 – 2016-01-30 (×2): 2 mg via INTRAVENOUS
  Filled 2016-01-29 (×2): qty 2

## 2016-01-29 MED ORDER — FENTANYL CITRATE (PF) 100 MCG/2ML IJ SOLN: 50.0000 ug | Freq: Once | INTRAMUSCULAR | Status: DC

## 2016-01-29 MED ORDER — FENTANYL CITRATE (PF) 100 MCG/2ML IJ SOLN
INTRAMUSCULAR | Status: AC | PRN
  Administered 2016-01-29 (×4): 100 ug via INTRAVENOUS

## 2016-01-29 MED ORDER — NEOMYCIN-POLYMYXIN-HC 3.5-10000-1 OT SUSP
3.0000 [drp] | OTIC | Status: DC
  Filled 2016-01-29: qty 10

## 2016-01-29 MED ORDER — FENTANYL CITRATE (PF) 100 MCG/2ML IJ SOLN
INTRAMUSCULAR | Status: AC
  Filled 2016-01-29: qty 2

## 2016-01-29 MED ORDER — LIDOCAINE-EPINEPHRINE 1 %-1:100000 IJ SOLN
INTRAMUSCULAR | Status: DC | PRN
  Administered 2016-01-29: 9 mL

## 2016-01-29 MED ORDER — MIDAZOLAM HCL 5 MG/5ML IJ SOLN
INTRAMUSCULAR | Status: DC | PRN
  Administered 2016-01-29: 2 mg via INTRAVENOUS

## 2016-01-29 MED ORDER — FENTANYL CITRATE (PF) 250 MCG/5ML IJ SOLN
INTRAMUSCULAR | Status: AC
  Filled 2016-01-29: qty 5

## 2016-01-29 MED ORDER — LACTATED RINGERS IV SOLN
INTRAVENOUS | Status: DC | PRN
  Administered 2016-01-29: 04:00:00 via INTRAVENOUS

## 2016-01-29 MED ORDER — CEFAZOLIN SODIUM-DEXTROSE 2-3 GM-% IV SOLR
INTRAVENOUS | Status: DC | PRN
  Administered 2016-01-29: 3 g via INTRAVENOUS

## 2016-01-29 MED ORDER — CEFAZOLIN SODIUM 1-5 GM-% IV SOLN: 1.0000 g | Freq: Three times a day (TID) | INTRAVENOUS | Status: DC

## 2016-01-29 MED ORDER — PROPOFOL 10 MG/ML IV BOLUS
INTRAVENOUS | Status: AC
  Filled 2016-01-29: qty 20

## 2016-01-29 MED ORDER — MIDAZOLAM HCL 2 MG/2ML IJ SOLN
INTRAMUSCULAR | Status: AC
  Filled 2016-01-29: qty 6

## 2016-01-29 MED ORDER — PROMETHAZINE HCL 25 MG RE SUPP: 25.0000 mg | Freq: Four times a day (QID) | RECTAL | Status: DC | PRN

## 2016-01-29 MED ORDER — NEOMYCIN-COLIST-HC-THONZONIUM 3.3-3-10-0.5 MG/ML OT SUSP
OTIC | Status: DC | PRN
  Administered 2016-01-29: 4 [drp]

## 2016-01-29 MED ORDER — BACITRACIN ZINC 500 UNIT/GM EX OINT
1.0000 | TOPICAL_OINTMENT | Freq: Three times a day (TID) | CUTANEOUS | Status: DC
Start: 2016-01-29 — End: 2016-02-02
  Administered 2016-01-29 – 2016-02-02 (×13): 1 via TOPICAL
  Filled 2016-01-29: qty 28.35

## 2016-01-29 MED ORDER — CHLORHEXIDINE GLUCONATE 0.12% ORAL RINSE (MEDLINE KIT)
15.0000 mL | Freq: Two times a day (BID) | OROMUCOSAL | Status: DC
  Administered 2016-01-29 – 2016-02-03 (×10): 15 mL via OROMUCOSAL
  Filled 2016-01-29: qty 15

## 2016-01-29 MED ORDER — PROMETHAZINE HCL 25 MG PO TABS: 25.0000 mg | ORAL_TABLET | Freq: Four times a day (QID) | ORAL | Status: DC | PRN

## 2016-01-29 MED ORDER — DEXTROSE-NACL 5-0.45 % IV SOLN
INTRAVENOUS | Status: DC
Start: 2016-01-29 — End: 2016-02-03
  Administered 2016-01-29 – 2016-02-03 (×6): via INTRAVENOUS

## 2016-01-29 MED ORDER — LIDOCAINE-EPINEPHRINE 1 %-1:100000 IJ SOLN
INTRAMUSCULAR | Status: AC
Start: 2016-01-29 — End: 2016-01-29
  Filled 2016-01-29: qty 1

## 2016-01-29 MED ORDER — BACIT-POLY-NEO HC 1 % EX OINT
TOPICAL_OINTMENT | CUTANEOUS | Status: DC | PRN
  Administered 2016-01-29: 1

## 2016-01-29 MED ORDER — MIDAZOLAM HCL 5 MG/5ML IJ SOLN
INTRAMUSCULAR | Status: AC | PRN
  Administered 2016-01-29: 5 mg via INTRAVENOUS

## 2016-01-29 MED ORDER — CEFAZOLIN SODIUM-DEXTROSE 2-3 GM-% IV SOLR
INTRAVENOUS | Status: AC
  Filled 2016-01-29: qty 50

## 2016-01-29 MED ORDER — FENTANYL BOLUS VIA INFUSION
50.0000 ug | INTRAVENOUS | Status: DC | PRN
  Filled 2016-01-29: qty 100

## 2016-01-29 MED ORDER — DEXTROSE 5 % IV SOLN
500.0000 mg | Freq: Four times a day (QID) | INTRAVENOUS | Status: DC
  Administered 2016-01-29 – 2016-02-03 (×20): 500 mg via INTRAVENOUS
  Filled 2016-01-29 (×25): qty 5

## 2016-01-29 MED ORDER — IBUPROFEN 100 MG/5ML PO SUSP
400.0000 mg | Freq: Four times a day (QID) | ORAL | Status: DC
  Administered 2016-01-29 – 2016-01-30 (×4): 400 mg
  Filled 2016-01-29 (×9): qty 20

## 2016-01-29 MED ORDER — PROPOFOL 1000 MG/100ML IV EMUL
5.0000 ug/kg/min | Freq: Once | INTRAVENOUS | Status: AC
  Administered 2016-01-29: 35 ug/kg/min via INTRAVENOUS

## 2016-01-29 MED ORDER — NEOMYCIN-POLYMYXIN-DEXAMETH 3.5-10000-0.1 OP OINT
TOPICAL_OINTMENT | OPHTHALMIC | Status: AC
  Filled 2016-01-29: qty 3.5

## 2016-01-29 MED ORDER — PANTOPRAZOLE SODIUM 40 MG IV SOLR
40.0000 mg | Freq: Every day | INTRAVENOUS | Status: DC
  Administered 2016-01-29: 40 mg via INTRAVENOUS
  Filled 2016-01-29: qty 40

## 2016-01-29 SURGICAL SUPPLY — 57 items
BLADE SURG 15 STRL LF DISP TIS (BLADE) IMPLANT
BLADE SURG 15 STRL SS (BLADE)
BLADE SURG ROTATE 9660 (MISCELLANEOUS) IMPLANT
CANISTER SUCTION 2500CC (MISCELLANEOUS) ×4 IMPLANT
CLEANER TIP ELECTROSURG 2X2 (MISCELLANEOUS) ×4 IMPLANT
CONFORMERS SILICONE 5649 (OPHTHALMIC RELATED) IMPLANT
COVER SURGICAL LIGHT HANDLE (MISCELLANEOUS) ×4 IMPLANT
COVER TABLE BACK 60X90 (DRAPES) IMPLANT
CRADLE DONUT ADULT HEAD (MISCELLANEOUS) ×4 IMPLANT
DECANTER SPIKE VIAL GLASS SM (MISCELLANEOUS) ×4 IMPLANT
DRAPE PROXIMA HALF (DRAPES) ×4 IMPLANT
DRESSING NASAL POPE 10X1.5X2.5 (GAUZE/BANDAGES/DRESSINGS) ×4 IMPLANT
DRSG NASAL POPE 10X1.5X2.5 (GAUZE/BANDAGES/DRESSINGS) ×8
ELECT COATED BLADE 2.86 ST (ELECTRODE) ×4 IMPLANT
ELECT NEEDLE TIP 2.8 STRL (NEEDLE) IMPLANT
ELECT REM PT RETURN 9FT ADLT (ELECTROSURGICAL) ×4
ELECTRODE REM PT RTRN 9FT ADLT (ELECTROSURGICAL) ×2 IMPLANT
GAUZE SPONGE 2X2 8PLY STRL LF (GAUZE/BANDAGES/DRESSINGS) ×2 IMPLANT
GLOVE BIOGEL PI IND STRL 7.5 (GLOVE) ×2 IMPLANT
GLOVE BIOGEL PI INDICATOR 7.5 (GLOVE) ×2
GLOVE ECLIPSE 8.0 STRL XLNG CF (GLOVE) ×12 IMPLANT
GLOVE SURG SS PI 7.5 STRL IVOR (GLOVE) ×4 IMPLANT
GOWN STRL REUS W/ TWL LRG LVL3 (GOWN DISPOSABLE) ×4 IMPLANT
GOWN STRL REUS W/ TWL XL LVL3 (GOWN DISPOSABLE) ×2 IMPLANT
GOWN STRL REUS W/TWL LRG LVL3 (GOWN DISPOSABLE) ×4
GOWN STRL REUS W/TWL XL LVL3 (GOWN DISPOSABLE) ×2
KIT BASIN OR (CUSTOM PROCEDURE TRAY) ×4 IMPLANT
KIT ROOM TURNOVER OR (KITS) ×4 IMPLANT
NEEDLE HYPO 25GX1X1/2 BEV (NEEDLE) ×8 IMPLANT
NS IRRIG 1000ML POUR BTL (IV SOLUTION) ×4 IMPLANT
PAD ARMBOARD 7.5X6 YLW CONV (MISCELLANEOUS) ×8 IMPLANT
PENCIL BUTTON HOLSTER BLD 10FT (ELECTRODE) ×4 IMPLANT
SCISSORS WIRE ANG 4 3/4 DISP (INSTRUMENTS) ×4 IMPLANT
SOLUTION BETADINE 4OZ (MISCELLANEOUS) ×8 IMPLANT
SPONGE DRAIN TRACH 4X4 STRL 2S (GAUZE/BANDAGES/DRESSINGS) ×4 IMPLANT
SPONGE GAUZE 2X2 STER 10/PKG (GAUZE/BANDAGES/DRESSINGS) ×2
STAPLER VISISTAT 35W (STAPLE) IMPLANT
SUT CHROMIC 2 0 SH (SUTURE) ×8 IMPLANT
SUT CHROMIC 3 0 PS 2 (SUTURE) IMPLANT
SUT CHROMIC 4 0 P 3 18 (SUTURE) IMPLANT
SUT CHROMIC 5 0 P 3 (SUTURE) IMPLANT
SUT ETHILON 2 0 FS 18 (SUTURE) IMPLANT
SUT ETHILON 5 0 P 3 18 (SUTURE)
SUT ETHILON 6 0 P 1 (SUTURE) IMPLANT
SUT NYLON ETHILON 5-0 P-3 1X18 (SUTURE) IMPLANT
SUT SILK 2 0 FS (SUTURE) IMPLANT
SUT SILK 2 0 SH CR/8 (SUTURE) ×4 IMPLANT
SUT STEEL 0 (SUTURE)
SUT STEEL 0 18XMFL TIE 17 (SUTURE) IMPLANT
SUT STEEL 1 (SUTURE) IMPLANT
SUT STEEL 2 (SUTURE) IMPLANT
SUT STEEL 4 (SUTURE) IMPLANT
SYR 3ML LL SCALE MARK (SYRINGE) ×8 IMPLANT
SYR CONTROL 10ML LL (SYRINGE) ×8 IMPLANT
TOWEL OR 17X24 6PK STRL BLUE (TOWEL DISPOSABLE) ×4 IMPLANT
TRAY ENT MC OR (CUSTOM PROCEDURE TRAY) ×4 IMPLANT
WATER STERILE IRR 1000ML POUR (IV SOLUTION) ×4 IMPLANT

## 2016-01-29 NOTE — ED Notes (Signed)
Dr.Glick at bedside for intubation 
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
## 2016-01-29 NOTE — Anesthesia Postprocedure Evaluation (Signed)
Anesthesia Post Note  Patient: Bobby Holmes  Procedure(s) Performed: Procedure(s) (LRB): TRACHEOSTOMY (N/A)  MANDIBULAR AND MAXILLARY FIXATION, POSSIBLE NASAL PACKING (N/A) INSERTION OF NASAL-PHARYNGEAL APPLICATOR  Patient location during evaluation: NICU Anesthesia Type: General Level of consciousness: patient remains intubated per anesthesia plan Pain management: pain level controlled Vital Signs Assessment: vitals unstable Respiratory status: patient remains intubated per anesthesia plan Cardiovascular status: stable Anesthetic complications: no    Last Vitals:  Filed Vitals:   01/29/16 0325 01/29/16 0330  BP: 115/74 106/80  Pulse: 82 83  Temp:    Resp: 24 24    Last Pain:  Filed Vitals:   01/29/16 0332  PainSc: 10-Worst pain ever                 Janelle Spellman,JAMES TERRILL

## 2016-01-29 NOTE — Progress Notes (Signed)
Unable to chart patient's 4AM ventilator check due to rush to OR. Report called to Neuro RT.
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
## 2016-01-29 NOTE — Progress Notes (Signed)
Orthopedic Tech Progress Note Patient Details:  Bobby Holmes 10-11-1976 [id number]  Patient ID: Bobby Holmes, male   DOB: November 08, 1976, 40 y.o.   MRN: [id number] Trauma page  Trinna Post 01/29/2016, 1:31 AM

## 2016-01-29 NOTE — Progress Notes (Signed)
Patient awake and following commands. Patient coughing and fight the pressure support. Placed patient on 28% aerosol trach collar. Sp02=99% and patient looks and stated he felt more comfortable with out vent support. Will continue to monitor patients status.

## 2016-01-29 NOTE — Transfer of Care (Signed)
Immediate Anesthesia Transfer of Care Note  Patient: Bobby Holmes  Procedure(s) Performed: Procedure(s): TRACHEOSTOMY (N/A)  MANDIBULAR AND MAXILLARY FIXATION, POSSIBLE NASAL PACKING (N/A) INSERTION OF NASAL-PHARYNGEAL APPLICATOR  Patient Location: NICU  Anesthesia Type:General  Level of Consciousness: sedated and Patient remains intubated per anesthesia plan  Airway & Oxygen Therapy: Patient remains intubated per anesthesia plan and Patient placed on Ventilator (see vital sign flow sheet for setting)  Post-op Assessment: Report given to RN and Post -op Vital signs reviewed and stable  Post vital signs: Reviewed and stable  Last Vitals:  Filed Vitals:   01/29/16 0325 01/29/16 0330  BP: 115/74 106/80  Pulse: 82 83  Temp:    Resp: 24 24    Complications: No apparent anesthesia complications
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
## 2016-01-29 NOTE — ED Notes (Signed)
Pt to CT with RN
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
## 2016-01-29 NOTE — ED Notes (Signed)
ENT at bedside; pt to be taken to OR
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
## 2016-01-29 NOTE — ED Notes (Signed)
CSI at bedside.

## 2016-01-29 NOTE — Progress Notes (Signed)
   01/29/16 1727  Clinical Encounter Type  Visited With Family;Health care provider  Visit Type Initial;Critical Care  Referral From Nurse  Spiritual Encounters  Spiritual Needs Emotional  Stress Factors  Family Stress Factors Health changes   Chaplain responded to a crying family member (fiance) in the waiting area. Chaplain offered support and hospitality. Our services are available as needed.   Alda Ponder, Chaplain 01/29/2016 5:28 PM

## 2016-01-30 ENCOUNTER — Inpatient Hospital Stay (HOSPITAL_COMMUNITY): Payer: No Typology Code available for payment source

## 2016-01-30 DIAGNOSIS — S41132A Puncture wound without foreign body of left upper arm, initial encounter: Secondary | ICD-10-CM

## 2016-01-30 DIAGNOSIS — S21139A Puncture wound without foreign body of unspecified front wall of thorax without penetration into thoracic cavity, initial encounter: Secondary | ICD-10-CM

## 2016-01-30 DIAGNOSIS — W3400XA Accidental discharge from unspecified firearms or gun, initial encounter: Secondary | ICD-10-CM

## 2016-01-30 DIAGNOSIS — J9600 Acute respiratory failure, unspecified whether with hypoxia or hypercapnia: Secondary | ICD-10-CM | POA: Diagnosis present

## 2016-01-30 DIAGNOSIS — S0183XA Puncture wound without foreign body of other part of head, initial encounter: Secondary | ICD-10-CM

## 2016-01-30 DIAGNOSIS — E781 Pure hyperglyceridemia: Secondary | ICD-10-CM | POA: Diagnosis present

## 2016-01-30 DIAGNOSIS — D62 Acute posthemorrhagic anemia: Secondary | ICD-10-CM | POA: Diagnosis not present

## 2016-01-30 LAB — CBC
HCT: 37.6 % — ABNORMAL LOW (ref 39.0–52.0)
Hemoglobin: 12.8 g/dL — ABNORMAL LOW (ref 13.0–17.0)
MCH: 32.2 pg (ref 26.0–34.0)
MCHC: 34 g/dL (ref 30.0–36.0)
MCV: 94.5 fL (ref 78.0–100.0)
PLATELETS: 226 10*3/uL (ref 150–400)
RBC: 3.98 MIL/uL — AB (ref 4.22–5.81)
RDW: 12.9 % (ref 11.5–15.5)
WBC: 17.7 10*3/uL — AB (ref 4.0–10.5)

## 2016-01-30 LAB — BASIC METABOLIC PANEL
ANION GAP: 14 (ref 5–15)
BUN: 8 mg/dL (ref 6–20)
CO2: 24 mmol/L (ref 22–32)
Calcium: 9.6 mg/dL (ref 8.9–10.3)
Chloride: 101 mmol/L (ref 101–111)
Creatinine, Ser: 0.96 mg/dL (ref 0.61–1.24)
Glucose, Bld: 102 mg/dL — ABNORMAL HIGH (ref 65–99)
POTASSIUM: 3.2 mmol/L — AB (ref 3.5–5.1)
SODIUM: 139 mmol/L (ref 135–145)

## 2016-01-30 LAB — CDS SEROLOGY

## 2016-01-30 MED ORDER — FAMOTIDINE 40 MG/5ML PO SUSR
20.0000 mg | Freq: Two times a day (BID) | ORAL | Status: DC
  Administered 2016-01-30: 20 mg
  Filled 2016-01-30: qty 2.5

## 2016-01-30 MED ORDER — PANTOPRAZOLE SODIUM 40 MG PO PACK
80.0000 mg | PACK | Freq: Two times a day (BID) | ORAL | Status: DC
  Administered 2016-01-30 – 2016-02-03 (×8): 80 mg via ORAL
  Filled 2016-01-30 (×11): qty 40

## 2016-01-30 MED ORDER — POLYETHYLENE GLYCOL 3350 17 G PO PACK
17.0000 g | PACK | Freq: Every day | ORAL | Status: DC
  Administered 2016-01-30: 17 g
  Filled 2016-01-30: qty 1

## 2016-01-30 MED ORDER — PANTOPRAZOLE SODIUM 40 MG PO PACK
80.0000 mg | PACK | Freq: Two times a day (BID) | ORAL | Status: DC
  Administered 2016-01-30: 80 mg
  Filled 2016-01-30: qty 40

## 2016-01-30 MED ORDER — HYDROCODONE-ACETAMINOPHEN 7.5-325 MG/15ML PO SOLN: 10.0000 mL | ORAL | Status: DC | PRN

## 2016-01-30 MED ORDER — NICOTINE 21 MG/24HR TD PT24
21.0000 mg | MEDICATED_PATCH | TRANSDERMAL | Status: DC
  Administered 2016-01-30 – 2016-02-02 (×5): 21 mg via TRANSDERMAL
  Filled 2016-01-30 (×5): qty 1

## 2016-01-30 MED ORDER — HYDROMORPHONE HCL 1 MG/ML IJ SOLN
0.5000 mg | INTRAMUSCULAR | Status: DC | PRN
  Administered 2016-01-31 – 2016-02-03 (×9): 0.5 mg via INTRAVENOUS
  Filled 2016-01-30 (×11): qty 1

## 2016-01-30 MED ORDER — BOOST / RESOURCE BREEZE PO LIQD
1.0000 | Freq: Three times a day (TID) | ORAL | Status: DC
  Administered 2016-01-30 – 2016-02-03 (×10): 1 via ORAL

## 2016-01-30 MED ORDER — POLYETHYLENE GLYCOL 3350 17 G PO PACK
17.0000 g | PACK | Freq: Every day | ORAL | Status: DC
  Administered 2016-01-31 – 2016-02-02 (×3): 17 g via ORAL
  Filled 2016-01-30 (×4): qty 1

## 2016-01-30 MED ORDER — ENOXAPARIN SODIUM 40 MG/0.4ML ~~LOC~~ SOLN
40.0000 mg | SUBCUTANEOUS | Status: DC
  Administered 2016-01-30 – 2016-02-03 (×5): 40 mg via SUBCUTANEOUS
  Filled 2016-01-30 (×5): qty 0.4

## 2016-01-30 MED ORDER — FAMOTIDINE 40 MG/5ML PO SUSR
20.0000 mg | Freq: Two times a day (BID) | ORAL | Status: DC
  Administered 2016-01-30 – 2016-02-03 (×8): 20 mg via ORAL
  Filled 2016-01-30 (×10): qty 2.5

## 2016-01-30 MED ORDER — IBUPROFEN 100 MG/5ML PO SUSP
400.0000 mg | Freq: Four times a day (QID) | ORAL | Status: DC
  Administered 2016-01-30 – 2016-02-03 (×16): 400 mg via ORAL
  Filled 2016-01-30 (×18): qty 20

## 2016-01-30 MED ORDER — HYDROCODONE-ACETAMINOPHEN 7.5-325 MG/15ML PO SOLN
10.0000 mL | ORAL | Status: DC | PRN
  Administered 2016-01-30: 15 mL via ORAL
  Administered 2016-01-30: 20 mL via ORAL
  Administered 2016-01-31 – 2016-02-01 (×4): 15 mL via ORAL
  Administered 2016-02-01: 20 mL via ORAL
  Administered 2016-02-01: 15 mL via ORAL
  Administered 2016-02-01 – 2016-02-02 (×4): 20 mL via ORAL
  Administered 2016-02-03 (×4): 15 mL via ORAL
  Filled 2016-01-30: qty 15
  Filled 2016-01-30 (×3): qty 30
  Filled 2016-01-30 (×3): qty 15
  Filled 2016-01-30 (×4): qty 30
  Filled 2016-01-30 (×3): qty 15
  Filled 2016-01-30: qty 30
  Filled 2016-01-30: qty 15
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
## 2016-01-30 NOTE — Progress Notes (Signed)
Speech Pathology:  MBSS complete. Full report located under chart review in imaging section.  Mehkai Gallo L. Jhovany Weidinger, MA CCC/SLP Pager [phone]   

## 2016-01-30 NOTE — Progress Notes (Signed)
Placed pt on trach collar at this time due to pt uncomfortable on ventilator at this time.

## 2016-01-30 NOTE — Progress Notes (Signed)
placed pt on cpap/psv at this time due to pt having OSA and having apnea spells with desat. RT to monitor as needed
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
## 2016-01-31 ENCOUNTER — Encounter (HOSPITAL_COMMUNITY): Payer: Self-pay

## 2016-01-31 MED ORDER — MAGIC MOUTHWASH W/LIDOCAINE
15.0000 mL | Freq: Three times a day (TID) | ORAL | Status: DC
  Administered 2016-01-31 – 2016-02-03 (×11): 15 mL via ORAL
  Filled 2016-01-31 (×13): qty 15
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
## 2016-02-01 LAB — TRIGLYCERIDES: TRIGLYCERIDES: 175 mg/dL — AB (ref <150)

## 2016-02-01 MED ORDER — PHENOL 1.4 % MT LIQD
1.0000 | OROMUCOSAL | Status: DC | PRN
  Administered 2016-02-01: 1 via OROMUCOSAL
  Filled 2016-02-01: qty 177

## 2016-02-01 MED ORDER — SALINE SPRAY 0.65 % NA SOLN
4.0000 | Freq: Three times a day (TID) | NASAL | Status: DC | PRN
Start: 2016-02-01 — End: 2016-02-03
  Filled 2016-02-01: qty 44
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
## 2016-02-02 ENCOUNTER — Encounter (HOSPITAL_COMMUNITY): Payer: Self-pay | Admitting: Otolaryngology

## 2016-02-02 ENCOUNTER — Inpatient Hospital Stay (HOSPITAL_COMMUNITY): Payer: No Typology Code available for payment source

## 2016-02-02 MED ORDER — BACITRACIN ZINC 500 UNIT/GM EX OINT
TOPICAL_OINTMENT | Freq: Two times a day (BID) | CUTANEOUS | Status: DC
  Administered 2016-02-02 – 2016-02-03 (×3): via TOPICAL
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
## 2016-02-02 NOTE — Progress Notes (Signed)
02/02/2016 9:41 AM  Bobby Holmes [id number]  Post-Op Day 4    Temp:  [97.7 F (36.5 C)-98.9 F (37.2 C)] 97.7 F (36.5 C) (02/20 6962) Pulse Rate:  [54-74] 68 (02/20 0823) Resp:  [16-20] 16 (02/20 0823) BP: (133-156)/(82-93) 141/85 mmHg (02/20 0632) SpO2:  [97 %-100 %] 98 % (02/20 0823) FiO2 (%):  [21 %-28 %] 21 % (02/20 0357),     Intake/Output Summary (Last 24 hours) at 02/02/16 0941 Last data filed at 02/02/16 9528  Gross per 24 hour  Intake 2752.84 ml  Output   1350 ml  Net 1402.84 ml    No results found for this or any previous visit (from the past 24 hour(s)).   Panorex pending (ordered 2x)  SUBJECTIVE:  Large oral pain.  Mod facial pain.   Breathing and speaking with trach/PMV.    OBJECTIVE:  MMF secure.  Talking and breathing well.  Nasal packs removed.  Trach removed without difficulty.  Breathing well.    IMPRESSION:  Satisfactory check  PLAN:  Await Panorex.  Will plan discharge in AM tomorrow to let trach stoma heal x 24 hrs.    Increase nasal hygiene measures.  Rx's for Oxycodone liquid, Cephalexin liquid, Peridex oral rinse please.  Anticipate MMF x 2-3 mos.  Recheck my office late this week please.  Flo Shanks
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
## 2016-02-03 LAB — COMPREHENSIVE METABOLIC PANEL
ALK PHOS: 39 U/L (ref 38–126)
ALT: 29 U/L (ref 17–63)
ANION GAP: 9 (ref 5–15)
AST: 32 U/L (ref 15–41)
Albumin: 3 g/dL — ABNORMAL LOW (ref 3.5–5.0)
BUN: 5 mg/dL — ABNORMAL LOW (ref 6–20)
CALCIUM: 9 mg/dL (ref 8.9–10.3)
CHLORIDE: 103 mmol/L (ref 101–111)
CO2: 28 mmol/L (ref 22–32)
Creatinine, Ser: 0.9 mg/dL (ref 0.61–1.24)
GFR calc non Af Amer: >60 mL/min (ref >60)
Glucose, Bld: 110 mg/dL — ABNORMAL HIGH (ref 65–99)
Potassium: 3.3 mmol/L — ABNORMAL LOW (ref 3.5–5.1)
SODIUM: 140 mmol/L (ref 135–145)
Total Bilirubin: 0.6 mg/dL (ref 0.3–1.2)
Total Protein: 6.3 g/dL — ABNORMAL LOW (ref 6.5–8.1)

## 2016-02-03 LAB — CBC
HCT: 32.5 % — ABNORMAL LOW (ref 39.0–52.0)
Hemoglobin: 10.6 g/dL — ABNORMAL LOW (ref 13.0–17.0)
MCH: 31.2 pg (ref 26.0–34.0)
MCHC: 32.6 g/dL (ref 30.0–36.0)
MCV: 95.6 fL (ref 78.0–100.0)
PLATELETS: 297 10*3/uL (ref 150–400)
RBC: 3.4 MIL/uL — ABNORMAL LOW (ref 4.22–5.81)
RDW: 12.4 % (ref 11.5–15.5)
WBC: 6.1 10*3/uL (ref 4.0–10.5)

## 2016-02-03 MED ORDER — CHLORHEXIDINE GLUCONATE 0.12 % MT SOLN: 15.0000 mL | Freq: Two times a day (BID) | OROMUCOSAL | Status: DC

## 2016-02-03 MED ORDER — CEPHALEXIN 125 MG/5ML PO SUSR: 125.0000 mg | Freq: Four times a day (QID) | ORAL | Status: DC

## 2016-02-03 MED ORDER — HYDROCODONE-ACETAMINOPHEN 7.5-325 MG/15ML PO SOLN: 10.0000 mL | ORAL | Status: DC | PRN
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
## 2016-02-03 NOTE — Progress Notes (Signed)
02/03/2016 9:26 AM  Bobby Holmes [id number]  Post-Op Day 5    Temp:  [97.4 F (36.3 C)-98.2 F (36.8 C)] 97.4 F (36.3 C) (02/21 0423) Pulse Rate:  [62-75] 62 (02/21 0423) Resp:  [16-18] 18 (02/21 0423) BP: (138-154)/(71-99) 154/99 mmHg (02/21 0423) SpO2:  [97 %-98 %] 97 % (02/21 0423),     Intake/Output Summary (Last 24 hours) at 02/03/16 0926 Last data filed at 02/03/16 0425  Gross per 24 hour  Intake      0 ml  Output   2100 ml  Net  -2100 ml    Results for orders placed or performed during the hospital encounter of 01/29/16 (from the past 24 hour(s))  CBC     Status: Abnormal   Collection Time: 02/03/16  6:14 AM  Result Value Ref Range   WBC 6.1 4.0 - 10.5 K/uL   RBC 3.40 (L) 4.22 - 5.81 MIL/uL   Hemoglobin 10.6 (L) 13.0 - 17.0 g/dL   HCT 40.9 (L) 81.1 - 91.4 %   MCV 95.6 78.0 - 100.0 fL   MCH 31.2 26.0 - 34.0 pg   MCHC 32.6 30.0 - 36.0 g/dL   RDW 78.2 95.6 - 21.3 %   Platelets 297 150 - 400 K/uL  Comprehensive metabolic panel     Status: Abnormal   Collection Time: 02/03/16  6:14 AM  Result Value Ref Range   Sodium 140 135 - 145 mmol/L   Potassium 3.3 (L) 3.5 - 5.1 mmol/L   Chloride 103 101 - 111 mmol/L   CO2 28 22 - 32 mmol/L   Glucose, Bld 110 (H) 65 - 99 mg/dL   BUN 5 (L) 6 - 20 mg/dL   Creatinine, Ser 0.86 0.61 - 1.24 mg/dL   Calcium 9.0 8.9 - 57.8 mg/dL   Total Protein 6.3 (L) 6.5 - 8.1 g/dL   Albumin 3.0 (L) 3.5 - 5.0 g/dL   AST 32 15 - 41 U/L   ALT 29 17 - 63 U/L   Alkaline Phosphatase 39 38 - 126 U/L   Total Bilirubin 0.6 0.3 - 1.2 mg/dL   GFR calc non Af Amer >60 >60 mL/min   GFR calc Af Amer >60 >60 mL/min   Anion gap 9 5 - 15   Panorex:  Good occlusion.  LEFT condyle anterior to glenoid fossa.  SUBJECTIVE:  Pain controlled. Breathing OK.  Taking po liquids well  OBJECTIVE:  Less facial swelling. MMF secure.  Trach wound healing appropriately.  Still some air leak.  IMPRESSION:   Satisfactory check  PLAN:  OK for discharge.   Recheck my office 3-4 days.    Flo Shanks
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
## 2016-02-03 NOTE — Discharge Instructions (Signed)
Fractured-Jaw Meal Plan The purpose of the fractured-jaw meal plan is to provide foods that can be easily blended and easily swallowed. This plan is typically used after jaw or mouth surgery, wired jaw surgery, or dental surgery. Foods in this plan need to be blended so that they can be sipped from a straw or given through a syringe. You should try to have at least three meals and three snacks daily. It is important to make sure you get enough calories and protein to prevent weight loss and help your body heal, especially after surgery. You may wish to include a liquid multivitamin in your plan to ensure that you get all the vitamins and minerals you need. Ask your health care provider for a recommendation.  HOW DO I PREPARE MY MEALS? All foods in this plan must be blended. Avoid nuts, seeds, skins, peels, bones, or any foods that cannot be blended to the right consistency. Make sure to eat a variety of foods from each food group every day. The following tips can help you as you blend your food:  Remove skins, seeds, and peels from food.  Cook meats and vegetables thoroughly.  Cut foods into small pieces and mix with a small amount of liquid in a food processor or blender. Continue to add liquid until the food becomes thin enough to sip through a straw.  Adding liquids such as juice, milk, cream, broth, gravy, or vegetable juice can help add flavor to foods.  Heat foods after they have been blended to reduce the amount of foam created from blending.  Heat or cool your foods to lukewarm temperatures if your teeth and mouth are sensitive to extreme temperatures. WHAT FOODS CAN I EAT? Make sure to eat a variety of foods from each food group.  Grains  Hot cereals, such as oatmeal, grits, ground wheat cereals, and polenta.  Rice and pasta.  Couscous. Vegetables  All cooked or canned vegetables, without seeds and skins.  Vegetable juices.  Cooked potatoes, without skins. Fruit  Any  cooked or canned fruits, without seeds and skins.  Fresh, peeled soft fruits, such as bananas and peaches, that can be blended until smooth.  All fruit juices, without seeds and skins. Meat and Other Protein Sources  Soft-boiled eggs, scrambled eggs, powdered eggs, pasteurized egg mixtures, and custard.  Ground meats, such as hamburger, Mal[place]urkey, sausage, and meatloaf.  Tender, well-cooked meat, poultry, and fish prepared without bones or skin.  Soft soy foods (such as tofu).  Smooth nut butters. Dairy  All are allowed. Beverages  Coffee (regular or decaffeinated), tea, and mineral water. Condiments  All seasonings and condiments that blend well. WHEN MAY I NEED TO SUPPLEMENT MY MEALS? If you begin to lose weight on this plan, you may need to increase the amount of food you are eating or the number of calories in your food or both. You can increase the number of calories by adding any of the following foods:  Protein powder or powdered milk.  Extra fats, such as margarine (without trans fat), sour cream, cream cheese, cream, and nut butters, such as peanut butter or almond butter.  Sweets, such as honey, ice cream, blackstrap molasses, or sugar.   This information is not intended to replace advice given to you by your health care provider. Make sure you discuss any questions you have with your health care provider.   Document Released: 05/19/2010 Document Revised: 12/20/2014 Document Reviewed: 10/26/2013 Elsevier Interactive Patient Education [employer]2016 Elsevier Inc.  No driving while taking hydrocodone. °

## 2016-02-03 NOTE — Progress Notes (Signed)
Pt waiting for his ride home around 1600hrs. Discharge teaching provided

## 2016-02-04 ENCOUNTER — Encounter (HOSPITAL_COMMUNITY): Payer: Self-pay | Admitting: Emergency Medicine

## 2016-02-05 ENCOUNTER — Telehealth (HOSPITAL_COMMUNITY): Payer: Self-pay

## 2016-02-05 NOTE — Telephone Encounter (Signed)
Called [facility] and told them he needed to f/u with Dr. Lazarus Salines but only prn with [place]. They will call and cx appt unless he's having another issue we can help him with.

## 2016-02-23 ENCOUNTER — Telehealth (HOSPITAL_COMMUNITY): Payer: Self-pay

## 2016-02-23 NOTE — Telephone Encounter (Signed)
Got a hold of the patient.  He was under the impression that he needed to come back to see us, but his main complaints are his face/jaw.  He's planning on calling Dr. Raye SorrowWolicki's office.  He says his left arm is still numb and tingly, but he can use it okay.  He's using ibuprofen to reduce inflammation and starting to use it more.  I instructed him to get an appt with a primary care provider and if his numbness in his arm persists beyond 6-8 weeks he may need referral to an orthopedic.  He should ice/heat and take motrin (with food) and start gentle activity with the arm.  I also discussed with him keeping it clean.  Will have him f/u with Dr. Lazarus SalinesWolicki re: pain refills
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
## 2016-04-09 ENCOUNTER — Emergency Department (HOSPITAL_COMMUNITY)
Admission: EM | Admit: 2016-04-09 | Discharge: 2016-04-09 | Disposition: A | Discharge status: Home / Self Care | Payer: No Typology Code available for payment source | Attending: Emergency Medicine | Admitting: Emergency Medicine

## 2016-04-09 ENCOUNTER — Encounter (HOSPITAL_COMMUNITY): Payer: Self-pay | Admitting: Emergency Medicine

## 2016-04-09 DIAGNOSIS — Z792 Long term (current) use of antibiotics: Secondary | ICD-10-CM | POA: Insufficient documentation

## 2016-04-09 DIAGNOSIS — T85848A Pain due to other internal prosthetic devices, implants and grafts, initial encounter: Secondary | ICD-10-CM | POA: Insufficient documentation

## 2016-04-09 DIAGNOSIS — Y658 Other specified misadventures during surgical and medical care: Secondary | ICD-10-CM | POA: Insufficient documentation

## 2016-04-09 DIAGNOSIS — M1990 Unspecified osteoarthritis, unspecified site: Secondary | ICD-10-CM | POA: Insufficient documentation

## 2016-04-09 DIAGNOSIS — T8484XA Pain due to internal orthopedic prosthetic devices, implants and grafts, initial encounter: Secondary | ICD-10-CM

## 2016-04-09 DIAGNOSIS — Z79899 Other long term (current) drug therapy: Secondary | ICD-10-CM | POA: Insufficient documentation
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
## 2016-04-09 NOTE — Discharge Instructions (Signed)
Please call Dr. Raye SorrowWolicki's office to schedule an appointment for evaluation and future removal of your jaw hardware.

## 2016-04-10 ENCOUNTER — Emergency Department (HOSPITAL_COMMUNITY)
Admission: EM | Admit: 2016-04-10 | Discharge: 2016-04-10 | Disposition: A | Discharge status: Home / Self Care | Payer: No Typology Code available for payment source | Attending: Emergency Medicine | Admitting: Emergency Medicine

## 2016-04-10 DIAGNOSIS — M1990 Unspecified osteoarthritis, unspecified site: Secondary | ICD-10-CM | POA: Insufficient documentation

## 2016-04-10 DIAGNOSIS — Z792 Long term (current) use of antibiotics: Secondary | ICD-10-CM | POA: Insufficient documentation

## 2016-04-10 DIAGNOSIS — Z79899 Other long term (current) drug therapy: Secondary | ICD-10-CM | POA: Insufficient documentation

## 2016-04-10 DIAGNOSIS — Z472 Encounter for removal of internal fixation device: Secondary | ICD-10-CM

## 2016-04-10 NOTE — ED Notes (Signed)
Pt has been seen earlier tonight for same.  Noelle PennerSerena Sam, [place] told pt that he would have to see his doctor for this procedure.  Pt left before triage was finished and without discharge instructions

## 2016-04-10 NOTE — ED Provider Notes (Signed)
CSN: [id number]     Arrival date & time 04/10/16  0013 History   First MD Initiated Contact with Patient 04/10/16 0049     No chief complaint on file.    HPI   Bobby Holmes is an 40 y.o. male who presents to the ED for evaluation of cut jaw wires. He was seen by me earlier tonight requesting removal of his MMF hardware. ENT was consulted and Dr. Lazarus SalinesWolicki recommended panorex in the ED with outpatient f/u when financially feasible. Pt states he went home and cut all the wires so he could eat but now the wires are poking his gums/lips. He is requesting removal.   Past Medical History  Diagnosis Date  . Arthritis    Past Surgical History  Procedure Laterality Date  . Knee surgery    . Tracheostomy tube placement N/A 01/29/2016    Procedure: TRACHEOSTOMY;  Surgeon: Flo ShanksKarol Wolicki, MD;  [facility];  Service: ENT;  Laterality: N/A;  . Orif mandibular fracture N/A 01/29/2016    Procedure:  MANDIBULAR AND MAXILLARY FIXATION, POSSIBLE NASAL PACKING;  Surgeon: Flo ShanksKarol Wolicki, MD;  [facility];  Service: ENT;  Laterality: N/A;  . Nasal-pharyngeal applicator insertion  01/29/2016    Procedure: INSERTION OF NASAL-PHARYNGEAL APPLICATOR;  Surgeon: Flo ShanksKarol Wolicki, MD;  [facility];  Service: ENT;;   Family History  Problem Relation Age of Onset  . Cirrhosis Father    Social History  Substance Use Topics  . Smoking status: Never Smoker   . Smokeless tobacco: Not on file  . Alcohol Use: Yes    Review of Systems  All other systems reviewed and are negative.     Allergies  Review of patient's allergies indicates no known allergies.  Home Medications   Prior to Admission medications   Medication Sig Start Date End Date Taking? Authorizing Provider  amoxicillin-clavulanate (AUGMENTIN) 875-125 MG tablet Take 1 tablet by mouth 2 (two) times daily. 11/06/15   Eyvonne MechanicJeffrey Hedges, PA-C  cephALEXin (KEFLEX) 125 MG/5ML suspension Take 5 mLs (125 mg total) by mouth 4 (four) times daily.  02/03/16   Freeman CaldronMichael J Jeffery, PA-C  chlorhexidine (PERIDEX) 0.12 % solution Use as directed 15 mLs in the mouth or throat 2 (two) times daily. 02/03/16   Freeman CaldronMichael J Jeffery, PA-C  cyclobenzaprine (FLEXERIL) 10 MG tablet Take 1 tablet (10 mg total) by mouth 3 (three) times daily as needed for muscle spasms. Patient not taking: Reported on 10/29/2015 08/11/14   Arthor CaptainAbigail Harris, PA-C  HYDROcodone-acetaminophen (HYCET) 7.5-325 mg/15 ml solution Take 10-20 mLs by mouth every 4 (four) hours as needed (Pain). 02/03/16   Freeman CaldronMichael J Jeffery, PA-C  ibuprofen (ADVIL,MOTRIN) 200 MG tablet Take 400 mg by mouth every 6 (six) hours as needed for moderate pain.    Historical Provider, MD  meloxicam (MOBIC) 15 MG tablet Take 1 tablet (15 mg total) by mouth daily. Take 1 daily with food. Patient not taking: Reported on 10/29/2015 08/11/14   Arthor CaptainAbigail Harris, PA-C  methocarbamol (ROBAXIN) 500 MG tablet Take 1 tablet (500 mg total) by mouth 2 (two) times daily. Patient not taking: Reported on 10/29/2015 08/05/15   Garlon HatchetLisa M Sanders, PA-C  naproxen (NAPROSYN) 500 MG tablet Take 1 tablet (500 mg total) by mouth 2 (two) times daily with a meal. Patient not taking: Reported on 10/29/2015 08/05/15   Garlon HatchetLisa M Sanders, PA-C  omeprazole (PRILOSEC) 20 MG capsule Take 1 capsule (20 mg total) by mouth daily. Patient not taking: Reported on 10/29/2015 01/09/14  Linwood Dibbles, MD  oxyCODONE-acetaminophen (PERCOCET/ROXICET) 5-325 MG per tablet Take 1 tablet by mouth every 6 (six) hours as needed for severe pain. Patient not taking: Reported on 10/29/2015 07/29/14   Charlestine Night, PA-C  phenol (CHLORASEPTIC) 1.4 % LIQD Use as directed 1 spray in the mouth or throat as needed for throat irritation / pain.    Historical Provider, MD  Phenyleph-Doxylamine-DM-APAP (ALKA SELTZER PLUS PO) Take 1 each by mouth daily.    Historical Provider, MD  Phenyleph-Doxylamine-DM-APAP (NYQUIL SEVERE COLD/FLU) 5-6.25-10-325 MG/15ML LIQD Take 30 mLs by mouth at bedtime.     Historical Provider, MD  predniSONE (DELTASONE) 50 MG tablet Take 1 tablet (50 mg total) by mouth daily. Patient not taking: Reported on 10/29/2015 07/29/14   Charlestine Night, PA-C   BP 122/85 mmHg  Pulse 84  Temp(Src) 98.7 F (37.1 C) (Oral)  Resp 20  SpO2 99% Physical Exam  Constitutional: He is oriented to person, place, and time. No distress.  HENT:  Head: Atraumatic.  Right Ear: External ear normal.  Left Ear: External ear normal.  Nose: Nose normal.  MMF hardware in place, now with cut wires. No trismus.   Eyes: Conjunctivae are normal. No scleral icterus.  Neck: Normal range of motion. Neck supple.  Cardiovascular: Normal rate and regular rhythm.   Pulmonary/Chest: Effort normal. No respiratory distress. He exhibits no tenderness.  Abdominal: Soft. He exhibits no distension.  Neurological: He is alert and oriented to person, place, and time.  Skin: Skin is warm and dry. He is not diaphoretic.  Psychiatric: He has a normal mood and affect. His behavior is normal.  Nursing note and vitals reviewed.   ED Course  Procedures (including critical care time) Labs Review Labs Reviewed - No data to display  Imaging Review No results found. I have personally reviewed and evaluated these images and lab results as part of my medical decision-making.   EKG Interpretation None      MDM   Final diagnoses:  Encounter for removal of internal fixation device    i discussed with pt again that there is no indication for emergent surgical removal of his hardware or further emergent evaluation or treatment. He will need to see ENT in office. I discussed with pt that he may purchase wax to put over the cut ends. He states he has wax at home. I offer CM consult/CM call after the weekend to discuss other financial options but pt declines. He ultimately left before receiving discharge paperwork. He was in stable condition and safe for outpatient f/u.     Carlene Coria,  PA-C 04/10/16 0104  Gilda Crease, MD 04/10/16 [phone]

## 2016-04-10 NOTE — Discharge Instructions (Signed)
Please call Dr. Raye SorrowWolicki's office to schedule an appointment as soon as possible. Use wax to cover the ends of the wires that you cut.

## 2016-05-25 ENCOUNTER — Ambulatory Visit (HOSPITAL_COMMUNITY)
Admission: RE | Admit: 2016-05-25 | Discharge: 2016-05-25 | Disposition: A | Discharge status: Home / Self Care | Payer: No Typology Code available for payment source | Source: Ambulatory Visit | Attending: Otolaryngology | Admitting: Otolaryngology

## 2016-05-25 ENCOUNTER — Other Ambulatory Visit: Payer: Self-pay | Admitting: Otolaryngology

## 2016-05-25 ENCOUNTER — Other Ambulatory Visit (HOSPITAL_COMMUNITY): Payer: Self-pay | Admitting: Otolaryngology

## 2016-05-25 DIAGNOSIS — S02650G Fracture of angle of mandible, unspecified side, subsequent encounter for fracture with delayed healing: Secondary | ICD-10-CM

## 2016-05-25 DIAGNOSIS — W3400XD Accidental discharge from unspecified firearms or gun, subsequent encounter: Secondary | ICD-10-CM | POA: Insufficient documentation
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
## 2016-05-26 ENCOUNTER — Other Ambulatory Visit (HOSPITAL_COMMUNITY): Payer: Self-pay | Admitting: Otolaryngology

## 2016-05-26 ENCOUNTER — Other Ambulatory Visit (HOSPITAL_COMMUNITY): Payer: Self-pay | Admitting: *Deleted

## 2016-05-26 ENCOUNTER — Encounter (HOSPITAL_COMMUNITY): Payer: Self-pay | Admitting: *Deleted

## 2016-05-26 MED ORDER — CEFAZOLIN SODIUM-DEXTROSE 2-4 GM/100ML-% IV SOLN
2.0000 g | INTRAVENOUS | Status: AC
Start: 2016-05-27 — End: 2016-05-27
  Administered 2016-05-27: 2 g via INTRAVENOUS
  Filled 2016-05-26: qty 100

## 2016-05-26 MED ORDER — OXYMETAZOLINE HCL 0.05 % NA SOLN
2.0000 | NASAL | Status: DC | PRN
  Administered 2016-05-27: 2 via NASAL
  Filled 2016-05-26: qty 15

## 2016-05-26 NOTE — Progress Notes (Signed)
Spoke with patient about surgery instructions.  Surgery 05/27/16 be here at 0830   He can take hydrocodone for pain if needed the morning and needs to 7 days prior to surgery STOP taking any Aspirin, Aleve, Naproxen, Ibuprofen, Motrin, Advil, Goody's, BC's, all herbal medications, fish oil, and all vitamins

## 2016-05-26 NOTE — Progress Notes (Signed)
Anesthesia Chart Review:  Pt is a 40 year old male scheduled for removal of mandible fracture hardware and of tooth fragment #16, excise L nasal synechiae on 05/27/2016 with Dr. Lazarus SalinesWolicki.   Pt is a same day work up.   PMH includes:  Current smoker. BMI 26. S/p trach, mandibular and maxillary fixation 01/29/16.   Labs will be obtained DOS.   EKG 11/06/15: Sinus rhythm. S1,S2,S3 pattern. Anteroseptal infarct, old. Minimal ST elevation, lateral leads. Baseline wander in lead(s) II III aVL aVF V1 V2 V3 V4 V5 V6  If labs acceptable DOS, I anticipate pt can proceed as scheduled.   Rica Mastngela Alphonsus Doyel, FNP-BC [facility]Anesthesiology Phone: [phone] 05/26/2016 4:23 PM

## 2016-05-27 ENCOUNTER — Encounter (HOSPITAL_COMMUNITY)
Admission: RE | Disposition: A | Discharge status: Home / Self Care | Payer: Self-pay | Source: Ambulatory Visit | Attending: Otolaryngology

## 2016-05-27 ENCOUNTER — Ambulatory Visit (HOSPITAL_COMMUNITY): Payer: Self-pay | Admitting: Anesthesiology

## 2016-05-27 ENCOUNTER — Encounter (HOSPITAL_COMMUNITY): Payer: Self-pay | Admitting: *Deleted

## 2016-05-27 ENCOUNTER — Ambulatory Visit (HOSPITAL_COMMUNITY)
Admission: RE | Admit: 2016-05-27 | Discharge: 2016-05-27 | Disposition: A | Discharge status: Home / Self Care | Payer: Self-pay | Source: Ambulatory Visit | Attending: Otolaryngology | Admitting: Otolaryngology

## 2016-05-27 DIAGNOSIS — F17210 Nicotine dependence, cigarettes, uncomplicated: Secondary | ICD-10-CM | POA: Insufficient documentation

## 2016-05-27 DIAGNOSIS — Z472 Encounter for removal of internal fixation device: Secondary | ICD-10-CM | POA: Insufficient documentation

## 2016-05-27 DIAGNOSIS — S025XXG Fracture of tooth (traumatic), subsequent encounter for fracture with delayed healing: Secondary | ICD-10-CM | POA: Insufficient documentation

## 2016-05-27 DIAGNOSIS — M1990 Unspecified osteoarthritis, unspecified site: Secondary | ICD-10-CM | POA: Insufficient documentation

## 2016-05-27 HISTORY — PX: MANDIBULAR HARDWARE REMOVAL: SHX5205

## 2016-05-27 HISTORY — DX: Fracture of mandible, unspecified, initial encounter for closed fracture: S02.609A

## 2016-05-27 LAB — CBC
HCT: 41.1 % (ref 39.0–52.0)
HEMOGLOBIN: 13.5 g/dL (ref 13.0–17.0)
MCH: 30.5 pg (ref 26.0–34.0)
MCHC: 32.8 g/dL (ref 30.0–36.0)
MCV: 92.8 fL (ref 78.0–100.0)
Platelets: 244 10*3/uL (ref 150–400)
RBC: 4.43 MIL/uL (ref 4.22–5.81)
RDW: 13.4 % (ref 11.5–15.5)
WBC: 6.3 10*3/uL (ref 4.0–10.5)

## 2016-05-27 LAB — BASIC METABOLIC PANEL
Anion gap: 11 (ref 5–15)
BUN: 6 mg/dL (ref 6–20)
CALCIUM: 9.2 mg/dL (ref 8.9–10.3)
CO2: 23 mmol/L (ref 22–32)
CREATININE: 0.89 mg/dL (ref 0.61–1.24)
Chloride: 104 mmol/L (ref 101–111)
GFR calc Af Amer: >60 mL/min (ref >60)
GFR calc non Af Amer: >60 mL/min (ref >60)
Glucose, Bld: 92 mg/dL (ref 65–99)
Potassium: 3.9 mmol/L (ref 3.5–5.1)
Sodium: 138 mmol/L (ref 135–145)

## 2016-05-27 SURGERY — REMOVAL, HARDWARE, MANDIBLE
Anesthesia: General | Laterality: Left

## 2016-05-27 MED ORDER — ONDANSETRON HCL 4 MG/2ML IJ SOLN
INTRAMUSCULAR | Status: DC | PRN
  Administered 2016-05-27: 4 mg via INTRAVENOUS

## 2016-05-27 MED ORDER — PROPOFOL 10 MG/ML IV BOLUS
INTRAVENOUS | Status: DC | PRN
  Administered 2016-05-27: 200 mg via INTRAVENOUS

## 2016-05-27 MED ORDER — LIDOCAINE-EPINEPHRINE 1 %-1:100000 IJ SOLN
INTRAMUSCULAR | Status: AC
  Filled 2016-05-27: qty 1

## 2016-05-27 MED ORDER — FENTANYL CITRATE (PF) 100 MCG/2ML IJ SOLN
INTRAMUSCULAR | Status: DC | PRN
  Administered 2016-05-27: 25 ug via INTRAVENOUS
  Administered 2016-05-27: 50 ug via INTRAVENOUS
  Administered 2016-05-27 (×2): 25 ug via INTRAVENOUS

## 2016-05-27 MED ORDER — MIDAZOLAM HCL 2 MG/2ML IJ SOLN
INTRAMUSCULAR | Status: AC
  Filled 2016-05-27: qty 2

## 2016-05-27 MED ORDER — ROCURONIUM BROMIDE 50 MG/5ML IV SOLN
INTRAVENOUS | Status: AC
  Filled 2016-05-27: qty 1

## 2016-05-27 MED ORDER — 0.9 % SODIUM CHLORIDE (POUR BTL) OPTIME
TOPICAL | Status: DC | PRN
  Administered 2016-05-27: 1000 mL

## 2016-05-27 MED ORDER — MIDAZOLAM HCL 5 MG/5ML IJ SOLN
INTRAMUSCULAR | Status: DC | PRN
  Administered 2016-05-27: 2 mg via INTRAVENOUS

## 2016-05-27 MED ORDER — ONDANSETRON HCL 4 MG/2ML IJ SOLN
INTRAMUSCULAR | Status: AC
  Filled 2016-05-27: qty 2

## 2016-05-27 MED ORDER — DEXAMETHASONE SODIUM PHOSPHATE 10 MG/ML IJ SOLN
INTRAMUSCULAR | Status: AC
  Filled 2016-05-27: qty 1

## 2016-05-27 MED ORDER — ONDANSETRON HCL 4 MG/2ML IJ SOLN: 4.0000 mg | Freq: Once | INTRAMUSCULAR | Status: DC | PRN

## 2016-05-27 MED ORDER — OXYCODONE HCL 5 MG PO TABS: 5.0000 mg | ORAL_TABLET | Freq: Once | ORAL | Status: DC | PRN

## 2016-05-27 MED ORDER — FENTANYL CITRATE (PF) 100 MCG/2ML IJ SOLN
INTRAMUSCULAR | Status: AC
  Filled 2016-05-27: qty 2

## 2016-05-27 MED ORDER — LACTATED RINGERS IV SOLN
INTRAVENOUS | Status: DC
  Administered 2016-05-27 (×2): via INTRAVENOUS

## 2016-05-27 MED ORDER — OXYCODONE HCL 5 MG/5ML PO SOLN: 5.0000 mg | Freq: Once | ORAL | Status: DC | PRN

## 2016-05-27 MED ORDER — FENTANYL CITRATE (PF) 100 MCG/2ML IJ SOLN
25.0000 ug | INTRAMUSCULAR | Status: DC | PRN
  Administered 2016-05-27 (×2): 50 ug via INTRAVENOUS

## 2016-05-27 MED ORDER — LIDOCAINE 2% (20 MG/ML) 5 ML SYRINGE
INTRAMUSCULAR | Status: AC
  Filled 2016-05-27: qty 5

## 2016-05-27 MED ORDER — DEXAMETHASONE SODIUM PHOSPHATE 10 MG/ML IJ SOLN
INTRAMUSCULAR | Status: DC | PRN
  Administered 2016-05-27: 10 mg via INTRAVENOUS

## 2016-05-27 MED ORDER — FENTANYL CITRATE (PF) 250 MCG/5ML IJ SOLN
INTRAMUSCULAR | Status: AC
  Filled 2016-05-27: qty 5

## 2016-05-27 MED ORDER — LIDOCAINE-EPINEPHRINE 1 %-1:100000 IJ SOLN
INTRAMUSCULAR | Status: DC | PRN
  Administered 2016-05-27: 20 mL

## 2016-05-27 MED ORDER — PROPOFOL 10 MG/ML IV BOLUS
INTRAVENOUS | Status: AC
  Filled 2016-05-27: qty 20

## 2016-05-27 SURGICAL SUPPLY — 33 items
BLADE SURG 15 STRL LF DISP TIS (BLADE) ×1 IMPLANT
BLADE SURG 15 STRL SS (BLADE) ×2
CANISTER SUCTION 2500CC (MISCELLANEOUS) ×3 IMPLANT
COVER MAYO STAND STRL (DRAPES) ×3 IMPLANT
COVER SURGICAL LIGHT HANDLE (MISCELLANEOUS) ×3 IMPLANT
CRADLE DONUT ADULT HEAD (MISCELLANEOUS) IMPLANT
DRAPE PROXIMA HALF (DRAPES) ×3 IMPLANT
ELECT COATED BLADE 2.86 ST (ELECTRODE) ×3 IMPLANT
GAUZE SPONGE 4X4 16PLY XRAY LF (GAUZE/BANDAGES/DRESSINGS) IMPLANT
GLOVE ECLIPSE 8.0 STRL XLNG CF (GLOVE) ×3 IMPLANT
GLOVE SKINSENSE NS SZ7.0 (GLOVE) ×4
GLOVE SKINSENSE STRL SZ7.0 (GLOVE) ×2 IMPLANT
GOWN STRL REUS W/ TWL LRG LVL3 (GOWN DISPOSABLE) ×1 IMPLANT
GOWN STRL REUS W/ TWL XL LVL3 (GOWN DISPOSABLE) ×1 IMPLANT
GOWN STRL REUS W/TWL LRG LVL3 (GOWN DISPOSABLE) ×2
GOWN STRL REUS W/TWL XL LVL3 (GOWN DISPOSABLE) ×2
KIT ROOM TURNOVER OR (KITS) ×3 IMPLANT
NEEDLE HYPO 25GX1X1/2 BEV (NEEDLE) IMPLANT
NEEDLE HYPO 25X1 1.5 SAFETY (NEEDLE) ×3 IMPLANT
NS IRRIG 1000ML POUR BTL (IV SOLUTION) ×3 IMPLANT
PACK EENT II TURBAN DRAPE (CUSTOM PROCEDURE TRAY) ×3 IMPLANT
PAD ARMBOARD 7.5X6 YLW CONV (MISCELLANEOUS) ×6 IMPLANT
PENCIL BUTTON HOLSTER BLD 10FT (ELECTRODE) ×3 IMPLANT
SHEET SILASTIC 8X6X.030 25-30 (MISCELLANEOUS) ×3 IMPLANT
SPONGE GAUZE 4X4 12PLY STER LF (GAUZE/BANDAGES/DRESSINGS) ×3 IMPLANT
SUT CHROMIC 3 0 SH 27 (SUTURE) IMPLANT
SUT STEEL 2 (SUTURE) IMPLANT
SUT STEEL 4 (SUTURE) IMPLANT
SYR BULB IRRIGATION 50ML (SYRINGE) ×3 IMPLANT
SYR CONTROL 10ML LL (SYRINGE) ×3 IMPLANT
TOWEL OR 17X24 6PK STRL BLUE (TOWEL DISPOSABLE) ×6 IMPLANT
TUBE CONNECTING 12'X1/4 (SUCTIONS) ×1
TUBE CONNECTING 12X1/4 (SUCTIONS) ×2 IMPLANT

## 2016-05-27 NOTE — Anesthesia Preprocedure Evaluation (Signed)
Anesthesia Evaluation  Patient identified by MRN, date of birth, ID band Patient awake    Reviewed: Allergy & Precautions, H&P , NPO status , Patient's Chart, lab work & pertinent test results  History of Anesthesia Complications Negative for: history of anesthetic complications  Airway Mallampati: II  TM Distance: >3 FB Neck ROM: full    Dental no notable dental hx.    Pulmonary Current Smoker,    Pulmonary exam normal breath sounds clear to auscultation       Cardiovascular negative cardio ROS Normal cardiovascular exam Rhythm:regular Rate:Normal     Neuro/Psych negative neurological ROS     GI/Hepatic negative GI ROS, Neg liver ROS,   Endo/Other  negative endocrine ROS  Renal/GU negative Renal ROS     Musculoskeletal  (+) Arthritis ,   Abdominal   Peds  Hematology negative hematology ROS (+)   Anesthesia Other Findings   Reproductive/Obstetrics negative OB ROS                             Anesthesia Physical Anesthesia Plan  ASA: II  Anesthesia Plan: General   Post-op Pain Management:    Induction: Intravenous  Airway Management Planned: Oral ETT and Nasal ETT  Additional Equipment:   Intra-op Plan:   Post-operative Plan:   Informed Consent: I have reviewed the patients History and Physical, chart, labs and discussed the procedure including the risks, benefits and alternatives for the proposed anesthesia with the patient or authorized representative who has indicated his/her understanding and acceptance.   Dental Advisory Given  Plan Discussed with: Anesthesiologist, CRNA and Surgeon  Anesthesia Plan Comments:         Anesthesia Quick Evaluation

## 2016-05-27 NOTE — Interval H&P Note (Signed)
History and Physical Interval Note:  05/27/2016 10:54 AM  Bobby Holmes  has presented today for surgery, with the diagnosis of OPEN FRACTURE LEFT MANDIBLE   The various methods of treatment have been discussed with the patient and family. After consideration of risks, benefits and other options for treatment, the patient has consented to  Procedure(s): REMOVAL OF MANDIBLE FRACTURE HARDWARE AND OF TOOTH FRAGMENT NUMBER 16,  EXCISE LEFT NASAL SYNECHIAE  (Left) as a surgical intervention .  The patient's history has been re-reviewed, patient re-examined, no change in status, stable for surgery.  I have re-reviewed the patient's chart and labs.  Questions were answered to the patient's satisfaction.     Flo ShanksWOLICKI, Alaiza Yau

## 2016-05-27 NOTE — Anesthesia Postprocedure Evaluation (Signed)
Anesthesia Post Note  Patient: Bobby Holmes  Procedure(s) Performed: Procedure(s) (LRB): REMOVAL OF MANDIBLE FRACTURE HARDWARE AND OF TOOTH FRAGMENT NUMBER 16,  EXCISE LEFT NASAL SYNECHIAE and insertion of nasal silastic guard (Left)  Patient location during evaluation: PACU Anesthesia Type: General Level of consciousness: awake and alert Pain management: pain level controlled Vital Signs Assessment: post-procedure vital signs reviewed and stable Respiratory status: spontaneous breathing, nonlabored ventilation, respiratory function stable and patient connected to nasal cannula oxygen Cardiovascular status: blood pressure returned to baseline and stable Postop Assessment: no signs of nausea or vomiting Anesthetic complications: no    Last Vitals:  Filed Vitals:   05/27/16 1230 05/27/16 1241  BP: 167/109 171/113  Pulse: 69 69  Temp: 36.6 C 36.7 C  Resp: 13 12    Last Pain:  Filed Vitals:   05/27/16 1245  PainSc: 0-No pain                 Reino KentJudd, Owain Eckerman J

## 2016-05-27 NOTE — Transfer of Care (Signed)
Immediate Anesthesia Transfer of Care Note  Patient: Bobby Holmes  Procedure(s) Performed: Procedure(s): REMOVAL OF MANDIBLE FRACTURE HARDWARE AND OF TOOTH FRAGMENT NUMBER 16,  EXCISE LEFT NASAL SYNECHIAE and insertion of nasal silastic guard (Left)  Patient Location: PACU  Anesthesia Type:General  Level of Consciousness: awake, oriented and patient cooperative  Airway & Oxygen Therapy: Patient Spontanous Breathing and Patient connected to nasal cannula oxygen  Post-op Assessment: Report given to RN and Post -op Vital signs reviewed and stable  Post vital signs: Reviewed  Last Vitals:  Filed Vitals:   05/27/16 0848  BP: 140/96  Pulse: 58  Temp: 37.2 C  Resp: 18    Last Pain:  Filed Vitals:   05/27/16 0848  PainSc: 7       Patients Stated Pain Goal: 2 (05/27/16 0845)  Complications: No apparent anesthesia complications

## 2016-05-27 NOTE — Anesthesia Procedure Notes (Signed)
Procedure Name: LMA Insertion Date/Time: 05/27/2016 11:23 AM Performed by: Romie MinusOCK, Asuka Dusseau K Pre-anesthesia Checklist: Patient identified, Emergency Drugs available, Suction available and Patient being monitored Patient Re-evaluated:Patient Re-evaluated prior to inductionOxygen Delivery Method: Circle System Utilized Preoxygenation: Pre-oxygenation with 100% oxygen Intubation Type: IV induction Ventilation: Mask ventilation without difficulty LMA: LMA inserted LMA Size: 4.0 Number of attempts: 1 Placement Confirmation: positive ETCO2 and breath sounds checked- equal and bilateral Tube secured with: To tape per surgeon request; tube marked for placement. Dental Injury: Teeth and Oropharynx as per pre-operative assessment
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
## 2016-05-27 NOTE — Discharge Instructions (Signed)
Use nasal hygiene measures.  Stay on antibiotics until we remove the nasal septal splints next week. Rinse your mouth frequently with cool dilute salt water, and after meals.  OK to brush your teeth.    Keep your appointment with the Oral Surgeon to complete removal of the back upper LEFT tooth.

## 2016-05-28 ENCOUNTER — Encounter (HOSPITAL_COMMUNITY): Payer: Self-pay | Admitting: Otolaryngology

## 2016-06-24 NOTE — ED Provider Notes (Signed)
Medical screening examination/treatment/procedure(s) were performed by non-physician practitioner and as supervising physician I was immediately available for consultation/collaboration.   EKG Interpretation None        Bobby LefevreJulie Nyla Creason, MD 06/24/16 1251

## 2017-05-02 IMAGING — CR DG CHEST 2V
2 series · 2 of 2 positions shown · non-contrast
Comparison: August 05, 2015

CLINICAL DATA: Fever and cough for 1 week.  Chest pain for 3 days

EXAM:
CHEST  2 VIEW

[w chest pa]
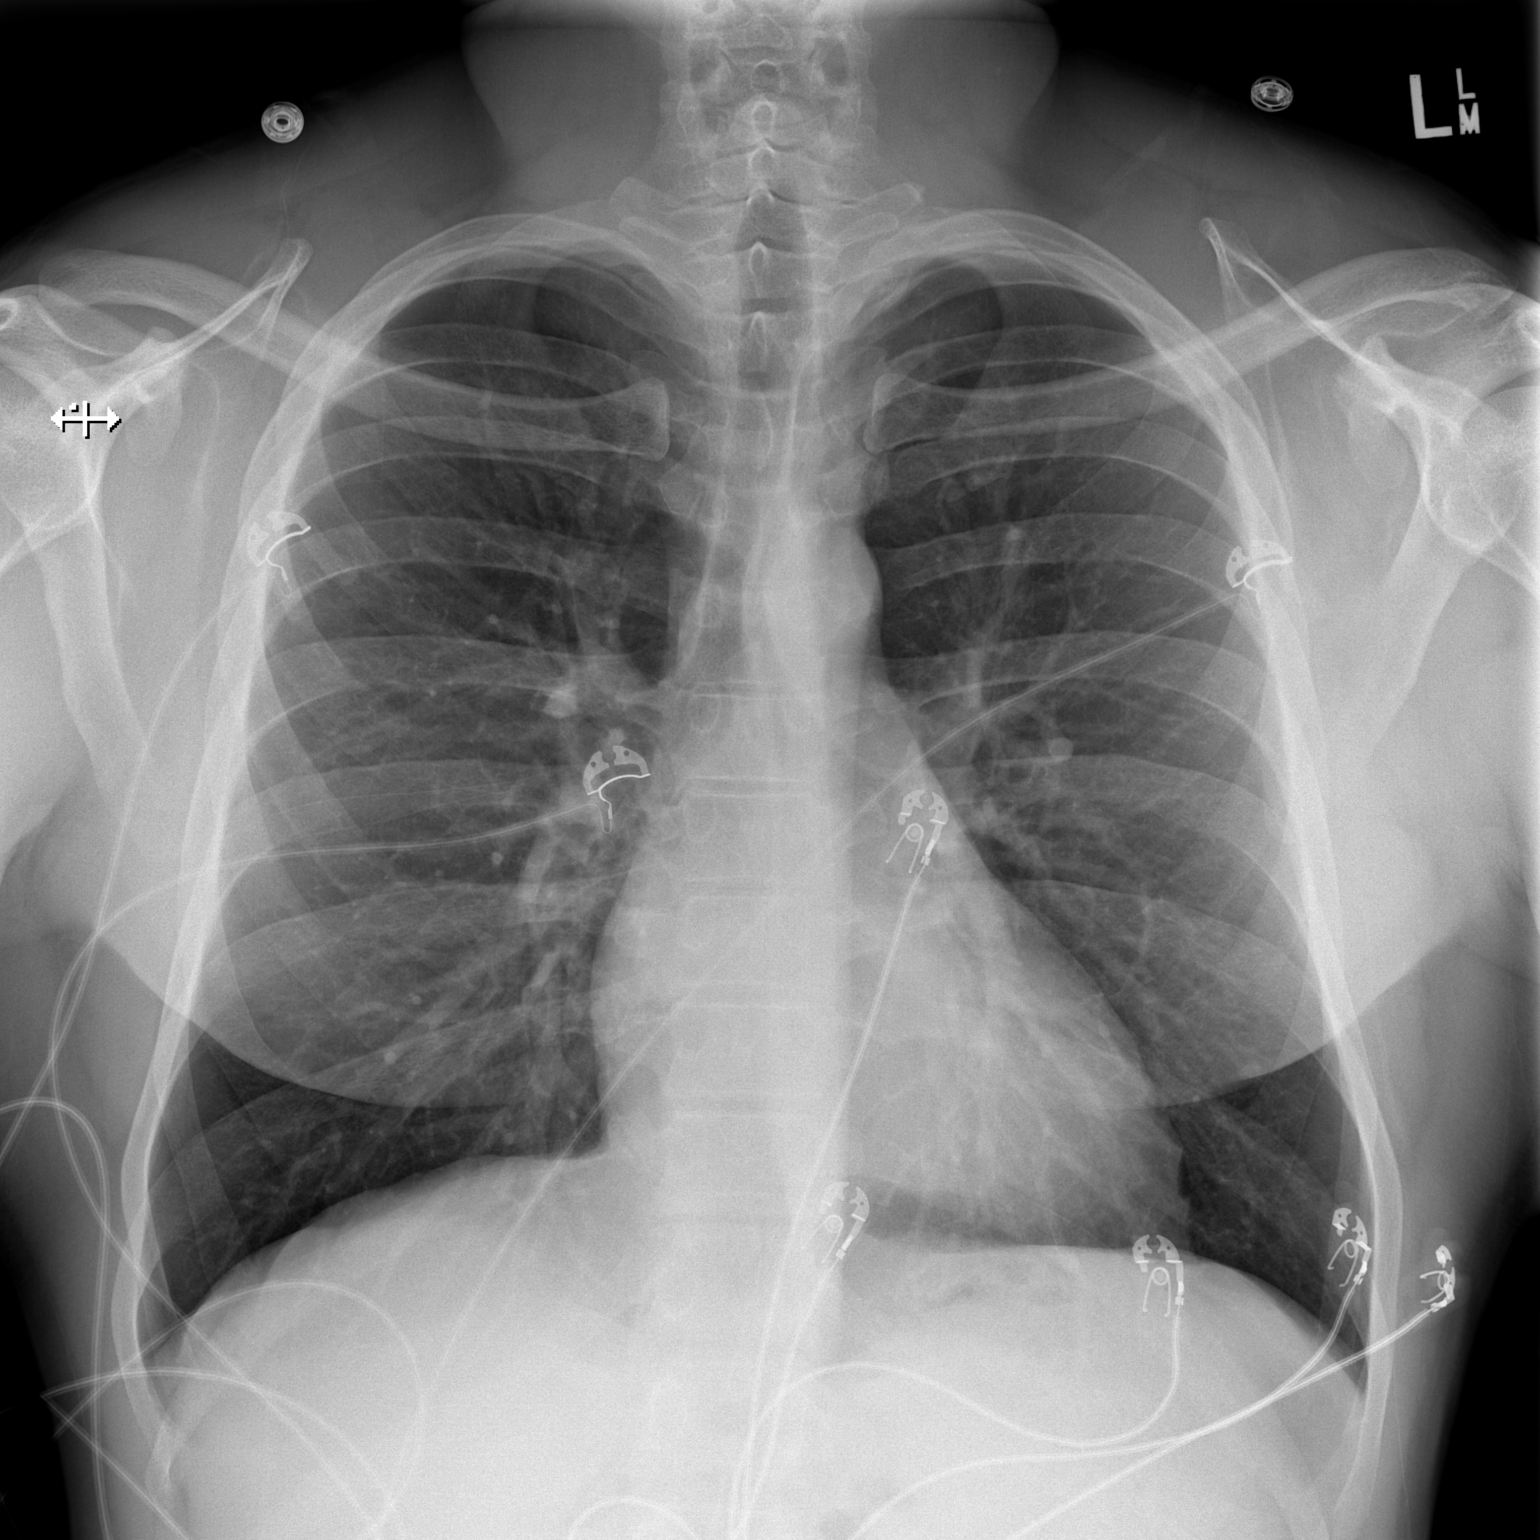

[w chest lat]
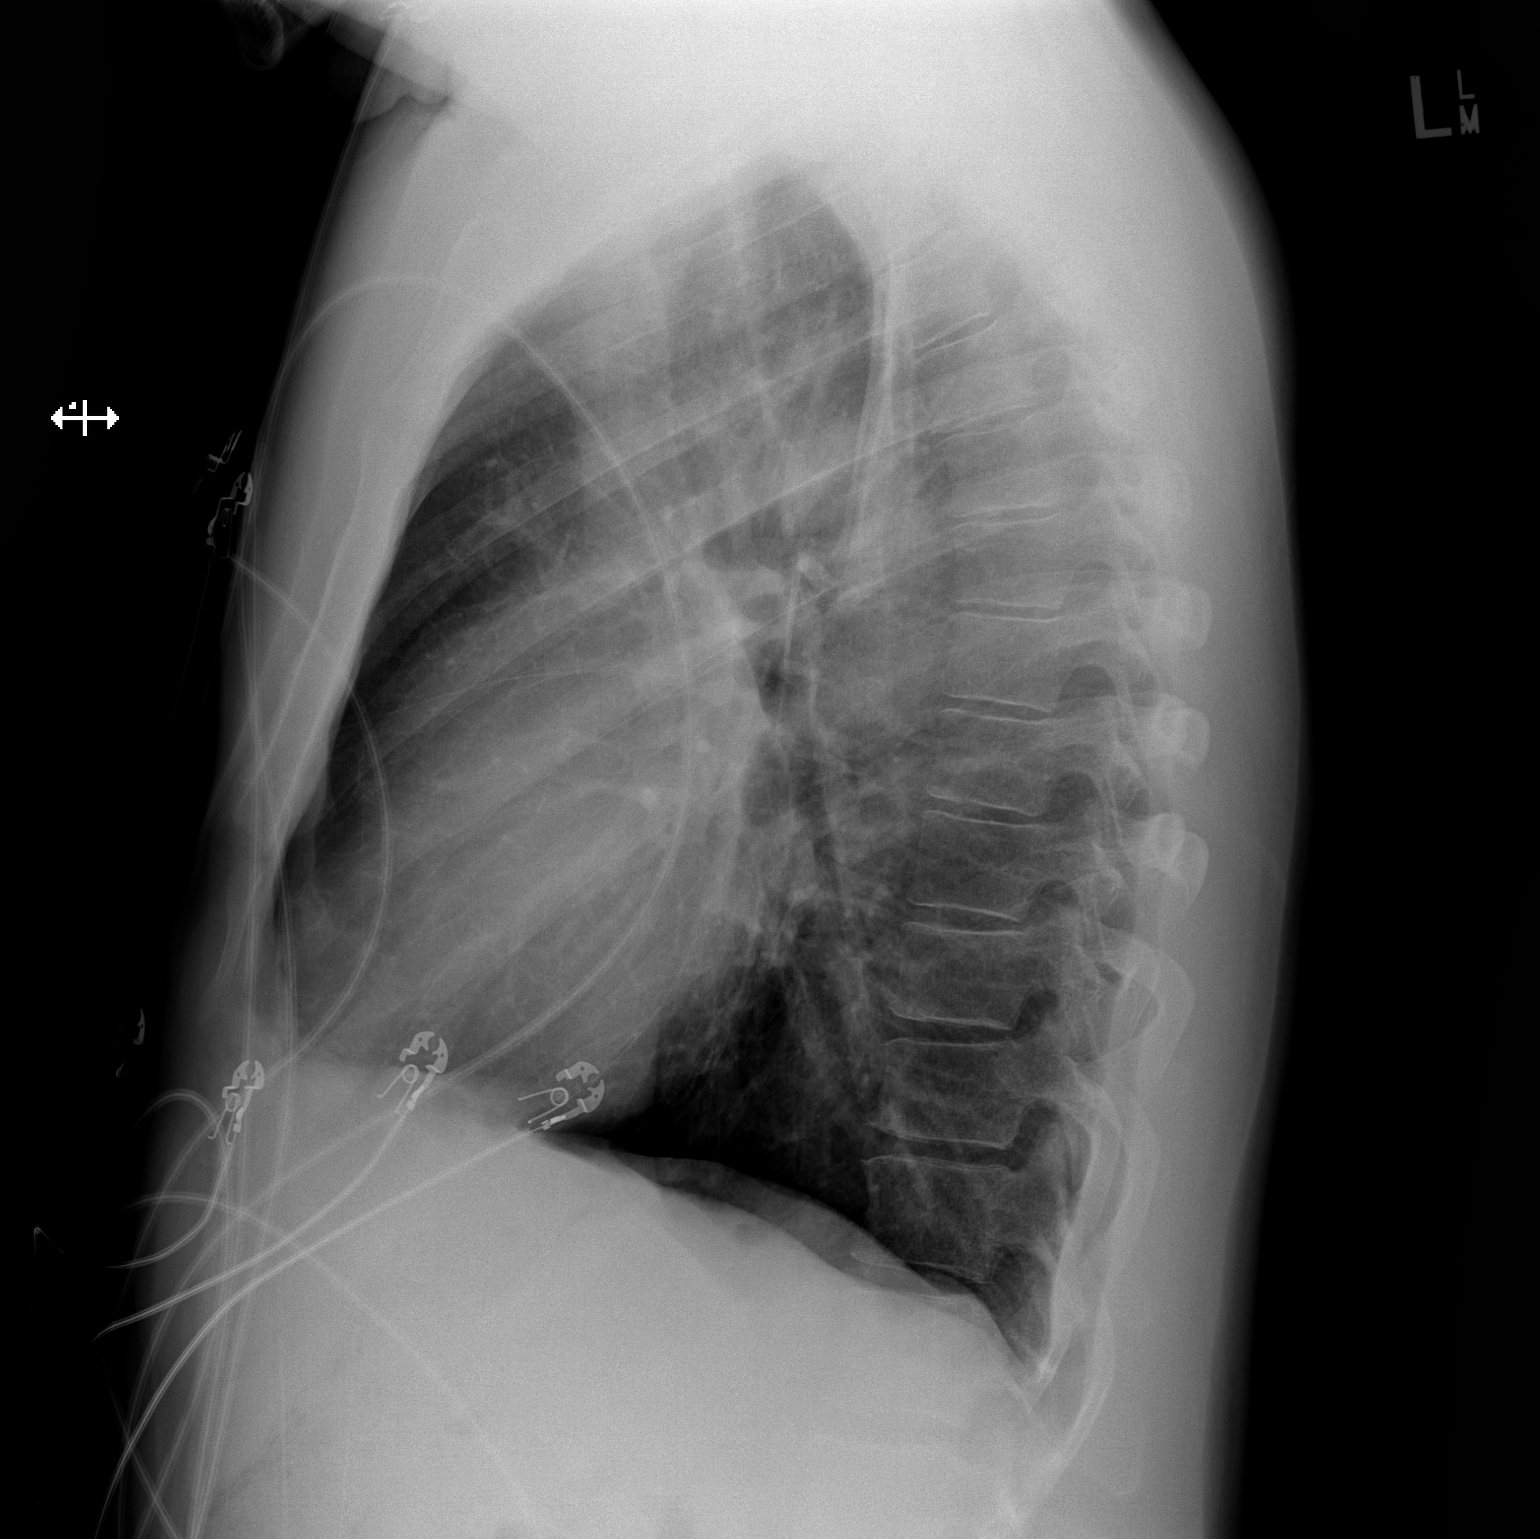

[2 of 2 positions shown; findings below may reference images not displayed]

FINDINGS: Lungs are clear. Heart size and pulmonary vascularity are normal. No
adenopathy. No pneumothorax. No bone lesions.
IMPRESSION: No abnormality noted.

## 2020-09-12 ENCOUNTER — Other Ambulatory Visit: Payer: Self-pay

## 2020-09-12 ENCOUNTER — Emergency Department (HOSPITAL_COMMUNITY): Payer: Self-pay

## 2020-09-12 ENCOUNTER — Encounter (HOSPITAL_COMMUNITY): Payer: Self-pay

## 2020-09-12 ENCOUNTER — Emergency Department (HOSPITAL_COMMUNITY)
Admission: EM | Admit: 2020-09-12 | Discharge: 2020-09-12 | Disposition: A | Discharge status: Home / Self Care | Payer: Self-pay | Attending: Emergency Medicine | Admitting: Emergency Medicine

## 2020-09-12 DIAGNOSIS — Y92410 Unspecified street and highway as the place of occurrence of the external cause: Secondary | ICD-10-CM | POA: Insufficient documentation

## 2020-09-12 DIAGNOSIS — S0031XA Abrasion of nose, initial encounter: Secondary | ICD-10-CM | POA: Insufficient documentation

## 2020-09-12 DIAGNOSIS — S161XXA Strain of muscle, fascia and tendon at neck level, initial encounter: Secondary | ICD-10-CM | POA: Insufficient documentation

## 2020-09-12 DIAGNOSIS — S00511A Abrasion of lip, initial encounter: Secondary | ICD-10-CM | POA: Insufficient documentation

## 2020-09-12 DIAGNOSIS — S0990XA Unspecified injury of head, initial encounter: Secondary | ICD-10-CM | POA: Insufficient documentation

## 2020-09-12 DIAGNOSIS — F17200 Nicotine dependence, unspecified, uncomplicated: Secondary | ICD-10-CM | POA: Insufficient documentation

## 2020-09-12 MED ORDER — CYCLOBENZAPRINE HCL 10 MG PO TABS: 10.0000 mg | ORAL_TABLET | Freq: Two times a day (BID) | ORAL | 0 refills | Status: AC | PRN

## 2020-09-12 MED ORDER — MELOXICAM 7.5 MG PO TABS: 7.5000 mg | ORAL_TABLET | Freq: Two times a day (BID) | ORAL | 0 refills | Status: AC | PRN

## 2020-09-12 NOTE — Discharge Instructions (Signed)
Your tests are normal, take the medicines as prescribed, seek medical exam for worsening symptoms but expect to have pain for approximately 1 to 2 weeks
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
## 2021-12-28 ENCOUNTER — Encounter (HOSPITAL_COMMUNITY): Payer: Self-pay | Admitting: Emergency Medicine

## 2021-12-28 ENCOUNTER — Emergency Department (HOSPITAL_COMMUNITY)
Admission: EM | Admit: 2021-12-28 | Discharge: 2021-12-28 | Disposition: A | Discharge status: Home / Self Care | Payer: Self-pay | Attending: Emergency Medicine | Admitting: Emergency Medicine

## 2021-12-28 ENCOUNTER — Emergency Department (HOSPITAL_COMMUNITY): Payer: Self-pay

## 2021-12-28 DIAGNOSIS — K7010 Alcoholic hepatitis without ascites: Secondary | ICD-10-CM | POA: Insufficient documentation

## 2021-12-28 DIAGNOSIS — R7989 Other specified abnormal findings of blood chemistry: Secondary | ICD-10-CM | POA: Insufficient documentation

## 2021-12-28 LAB — LIPASE, BLOOD: Lipase: 28 U/L (ref 11–51)

## 2021-12-28 LAB — COMPREHENSIVE METABOLIC PANEL
ALT: 113 U/L — ABNORMAL HIGH (ref 0–44)
AST: 104 U/L — ABNORMAL HIGH (ref 15–41)
Albumin: 4.2 g/dL (ref 3.5–5.0)
Alkaline Phosphatase: 58 U/L (ref 38–126)
Anion gap: 16 — ABNORMAL HIGH (ref 5–15)
BUN: 9 mg/dL (ref 6–20)
CO2: 24 mmol/L (ref 22–32)
Calcium: 10.2 mg/dL (ref 8.9–10.3)
Chloride: 97 mmol/L — ABNORMAL LOW (ref 98–111)
Creatinine, Ser: 0.8 mg/dL (ref 0.61–1.24)
GFR, Estimated: >60 mL/min (ref >60)
Glucose, Bld: 115 mg/dL — ABNORMAL HIGH (ref 70–99)
Potassium: 3.5 mmol/L (ref 3.5–5.1)
Sodium: 137 mmol/L (ref 135–145)
Total Bilirubin: 3.1 mg/dL — ABNORMAL HIGH (ref 0.3–1.2)
Total Protein: 7.7 g/dL (ref 6.5–8.1)

## 2021-12-28 LAB — URINALYSIS, ROUTINE W REFLEX MICROSCOPIC
Glucose, UA: NEGATIVE mg/dL
Hgb urine dipstick: NEGATIVE
Ketones, ur: 40 mg/dL — AB
Leukocytes,Ua: NEGATIVE
Nitrite: NEGATIVE
Protein, ur: >300 mg/dL — AB
Specific Gravity, Urine: 1.02 (ref 1.005–1.030)
pH: 8.5 — ABNORMAL HIGH (ref 5.0–8.0)

## 2021-12-28 LAB — URINALYSIS, MICROSCOPIC (REFLEX)

## 2021-12-28 LAB — CBC
HCT: 43.1 % (ref 39.0–52.0)
Hemoglobin: 15.2 g/dL (ref 13.0–17.0)
MCH: 34.7 pg — ABNORMAL HIGH (ref 26.0–34.0)
MCHC: 35.3 g/dL (ref 30.0–36.0)
MCV: 98.4 fL (ref 80.0–100.0)
Platelets: 197 10*3/uL (ref 150–400)
RBC: 4.38 MIL/uL (ref 4.22–5.81)
RDW: 12.4 % (ref 11.5–15.5)
WBC: 4.6 10*3/uL (ref 4.0–10.5)
nRBC: 0 % (ref 0.0–0.2)

## 2021-12-28 MED ORDER — ONDANSETRON 4 MG PO TBDP
4.0000 mg | ORAL_TABLET | Freq: Once | ORAL | Status: AC
  Administered 2021-12-28: 4 mg via ORAL
  Filled 2021-12-28: qty 1

## 2021-12-28 MED ORDER — LACTATED RINGERS IV BOLUS
2000.0000 mL | Freq: Once | INTRAVENOUS | Status: AC
  Administered 2021-12-28: 2000 mL via INTRAVENOUS

## 2021-12-28 MED ORDER — ONDANSETRON 8 MG PO TBDP: 8.0000 mg | ORAL_TABLET | Freq: Three times a day (TID) | ORAL | 0 refills | Status: AC | PRN

## 2021-12-28 NOTE — ED Provider Notes (Signed)
[facility] EMERGENCY DEPARTMENT Provider Note   CSN: [id number] Arrival date & time: 12/28/21  1237     History  Chief Complaint  Patient presents with   Abdominal Pain    Bobby Holmes is a 46 y.o. male.   Abdominal Pain Associated symptoms: no fever    Patient presents to the ER for evaluation of upper abdominal pain and nausea vomiting.  Patient states it started after excessively drinking alcohol on Saturday.  The next morning he woke up and was having a headache and vomiting.  He vomited multiple times throughout the day.  He was not able to keep anything down.  He started having pain in his upper abdomen.  Patient denies any diarrhea.  No dysuria.  He was concerned that he developed alcohol poisoning so he came to the ED today.  He was given an antinausea medication and is actually feeling somewhat better.  He has not had any vomiting since he has been here  Home Medications Prior to Admission medications   Medication Sig Start Date End Date Taking? Authorizing Provider  ondansetron (ZOFRAN-ODT) 8 MG disintegrating tablet Take 1 tablet (8 mg total) by mouth every 8 (eight) hours as needed for nausea or vomiting. 12/28/21  Yes Linwood Dibbles, MD  cyclobenzaprine (FLEXERIL) 10 MG tablet Take 1 tablet (10 mg total) by mouth 2 (two) times daily as needed for muscle spasms. 09/12/20   Eber Hong, MD  HYDROcodone-acetaminophen (HYCET) 7.5-325 mg/15 ml solution Take 10-20 mLs by mouth every 4 (four) hours as needed. 05/25/16   [provider]  ibuprofen (ADVIL,MOTRIN) 200 MG tablet Take 400 mg by mouth every 6 (six) hours as needed for moderate pain.    [provider]      Allergies    Patient has no known allergies.    Review of Systems   Review of Systems  Constitutional:  Negative for fever.  Gastrointestinal:  Positive for abdominal pain.   Physical Exam Updated Vital Signs BP (!) 149/108    Pulse 82    Temp 99.6 F (37.6 C) (Oral)     Resp 16    SpO2 100%  Physical Exam Vitals and nursing note reviewed.  Constitutional:      General: He is not in acute distress.    Appearance: He is well-developed.  HENT:     Head: Normocephalic and atraumatic.     Right Ear: External ear normal.     Left Ear: External ear normal.  Eyes:     General: No scleral icterus.       Right eye: No discharge.        Left eye: No discharge.     Conjunctiva/sclera: Conjunctivae normal.  Neck:     Trachea: No tracheal deviation.  Cardiovascular:     Rate and Rhythm: Normal rate and regular rhythm.  Pulmonary:     Effort: Pulmonary effort is normal. No respiratory distress.     Breath sounds: Normal breath sounds. No stridor. No wheezing or rales.  Abdominal:     General: Bowel sounds are normal. There is no distension.     Palpations: Abdomen is soft.     Tenderness: There is abdominal tenderness in the right upper quadrant and epigastric area. There is no guarding or rebound.  Musculoskeletal:        General: No tenderness or deformity.     Cervical back: Neck supple.  Skin:    General: Skin is warm and dry.  Findings: No rash.  Neurological:     General: No focal deficit present.     Mental Status: He is alert.     Cranial Nerves: No cranial nerve deficit (no facial droop, extraocular movements intact, no slurred speech).     Sensory: No sensory deficit.     Motor: No abnormal muscle tone or seizure activity.     Coordination: Coordination normal.  Psychiatric:        Mood and Affect: Mood normal.    ED Results / Procedures / Treatments   Labs (all labs ordered are listed, but only abnormal results are displayed) Labs Reviewed  COMPREHENSIVE METABOLIC PANEL - Abnormal; Notable for the following components:      Result Value   Chloride 97 (*)    Glucose, Bld 115 (*)    AST 104 (*)    ALT 113 (*)    Total Bilirubin 3.1 (*)    Anion gap 16 (*)    All other components within normal limits  CBC - Abnormal; Notable for  the following components:   MCH 34.7 (*)    All other components within normal limits  URINALYSIS, ROUTINE W REFLEX MICROSCOPIC - Abnormal; Notable for the following components:   Color, Urine AMBER (*)    pH 8.5 (*)    Bilirubin Urine SMALL (*)    Ketones, ur 40 (*)    Protein, ur >300 (*)    All other components within normal limits  URINALYSIS, MICROSCOPIC (REFLEX) - Abnormal; Notable for the following components:   Bacteria, UA RARE (*)    All other components within normal limits  LIPASE, BLOOD    EKG None  Radiology US Abdomen Limited RUQ (LIVER/GB)  Result Date: 12/28/2021 CLINICAL DATA:  Elevated LFTs EXAM: ULTRASOUND ABDOMEN LIMITED RIGHT UPPER QUADRANT COMPARISON:  None. FINDINGS: Gallbladder: No gallstones or wall thickening visualized. No sonographic Murphy sign noted by sonographer. Common bile duct: Diameter: 3.5 mm Liver: Increased echogenicity is noted without focal mass. Portal vein is patent on color Doppler imaging with normal direction of blood flow towards the liver. Other: None. IMPRESSION: Changes consistent with fatty infiltration of the liver. No acute abnormality is noted. Electronically Signed   By: Alcide Clever M.D.   On: 12/28/2021 19:36    Procedures Procedures    Medications Ordered in ED Medications  ondansetron (ZOFRAN-ODT) disintegrating tablet 4 mg (4 mg Oral Given 12/28/21 1408)  lactated ringers bolus 2,000 mL (2,000 mLs Intravenous New Bag/Given 12/28/21 1953)    ED Course/ Medical Decision Making/ A&P Clinical Course as of 12/28/21 2047  Mon Dec 28, 2021  1950 Laboratory test notable for elevated ketones, normal lipase, elevated LFTs and bilirubin.  CBC is normal [JK]  1951 Ultrasound shows evidence of fatty liver but no other acute findings [JK]    Clinical Course User Index [JK] Linwood Dibbles, MD                           Medical Decision Making  Patient presented with complaints of nausea vomiting after recent alcohol excess.   Patient's laboratory tests were notable for elevated LFTs.  In the ED he did not have any further episodes of nausea vomiting.  He was treated with IV fluids and IV antiemetics.  With his elevated liver enzymes ultrasound was performed and fortunately no signs of any biliary ductal dilatation or cholecystitis.  I suspect his symptoms were related to his alcohol use.  Patient is  now tolerating oral fluids.  Will discharge home with antinausea medications.  Recommend close outpatient follow-up to have his liver enzymes rechecked.        Final Clinical Impression(s) / ED Diagnoses Final diagnoses:  Elevated LFTs  Acute alcoholic hepatitis    Rx / DC Orders ED Discharge Orders          Ordered    ondansetron (ZOFRAN-ODT) 8 MG disintegrating tablet  Every 8 hours PRN        12/28/21 2022              Linwood DibblesKnapp, Shakerria Parran, MD 12/28/21 2047

## 2021-12-28 NOTE — Discharge Instructions (Addendum)
Avoid drinking alcohol.  Take the medications for nausea as needed.  Follow-up with a primary care doctor to be rechecked in a couple weeks to have your liver enzymes tested.  Return to the ED for worsening symptoms

## 2021-12-28 NOTE — ED Triage Notes (Signed)
Patient requesting evaluation for alcohol poisoning, patient states on Saturday (two days ago) the patient drank heavily, then woke up on Sunday with a headache and vomiting. Patient states he has vomited twice today and, after reportedly reading articles on the Internet, is concerned he may have alcohol poisoning. Patient alert, oriented, ambulatory, and in no apparent distress at this time.
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
## 2023-01-25 ENCOUNTER — Emergency Department (HOSPITAL_BASED_OUTPATIENT_CLINIC_OR_DEPARTMENT_OTHER)
Admission: EM | Admit: 2023-01-25 | Discharge: 2023-01-25 | Disposition: A | Discharge status: Home / Self Care | Payer: Commercial Managed Care - PPO | Attending: Emergency Medicine | Admitting: Emergency Medicine

## 2023-01-25 ENCOUNTER — Encounter (HOSPITAL_BASED_OUTPATIENT_CLINIC_OR_DEPARTMENT_OTHER): Payer: Self-pay

## 2023-01-25 ENCOUNTER — Emergency Department (HOSPITAL_BASED_OUTPATIENT_CLINIC_OR_DEPARTMENT_OTHER): Payer: Commercial Managed Care - PPO

## 2023-01-25 ENCOUNTER — Other Ambulatory Visit: Payer: Self-pay

## 2023-01-25 DIAGNOSIS — Z79899 Other long term (current) drug therapy: Secondary | ICD-10-CM | POA: Diagnosis not present

## 2023-01-25 DIAGNOSIS — R519 Headache, unspecified: Secondary | ICD-10-CM | POA: Diagnosis not present

## 2023-01-25 DIAGNOSIS — F17210 Nicotine dependence, cigarettes, uncomplicated: Secondary | ICD-10-CM | POA: Insufficient documentation

## 2023-01-25 DIAGNOSIS — Z1152 Encounter for screening for COVID-19: Secondary | ICD-10-CM | POA: Insufficient documentation

## 2023-01-25 DIAGNOSIS — I10 Essential (primary) hypertension: Secondary | ICD-10-CM | POA: Diagnosis not present

## 2023-01-25 LAB — CBC WITH DIFFERENTIAL/PLATELET
Abs Immature Granulocytes: 0.01 10*3/uL (ref 0.00–0.07)
Basophils Absolute: 0 10*3/uL (ref 0.0–0.1)
Basophils Relative: 0 %
Eosinophils Absolute: 0 10*3/uL (ref 0.0–0.5)
Eosinophils Relative: 0 %
HCT: 39.3 % (ref 39.0–52.0)
Hemoglobin: 13.2 g/dL (ref 13.0–17.0)
Immature Granulocytes: 0 %
Lymphocytes Relative: 34 %
Lymphs Abs: 1.6 10*3/uL (ref 0.7–4.0)
MCH: 31.1 pg (ref 26.0–34.0)
MCHC: 33.6 g/dL (ref 30.0–36.0)
MCV: 92.5 fL (ref 80.0–100.0)
Monocytes Absolute: 0.4 10*3/uL (ref 0.1–1.0)
Monocytes Relative: 8 %
Neutro Abs: 2.7 10*3/uL (ref 1.7–7.7)
Neutrophils Relative %: 58 %
Platelets: 277 10*3/uL (ref 150–400)
RBC: 4.25 MIL/uL (ref 4.22–5.81)
RDW: 14.9 % (ref 11.5–15.5)
WBC: 4.8 10*3/uL (ref 4.0–10.5)
nRBC: 0 % (ref 0.0–0.2)

## 2023-01-25 LAB — COMPREHENSIVE METABOLIC PANEL
ALT: 11 U/L (ref 0–44)
AST: 21 U/L (ref 15–41)
Albumin: 4.5 g/dL (ref 3.5–5.0)
Alkaline Phosphatase: 54 U/L (ref 38–126)
Anion gap: 14 (ref 5–15)
BUN: 10 mg/dL (ref 6–20)
CO2: 27 mmol/L (ref 22–32)
Calcium: 9.9 mg/dL (ref 8.9–10.3)
Chloride: 102 mmol/L (ref 98–111)
Creatinine, Ser: 0.66 mg/dL (ref 0.61–1.24)
GFR, Estimated: >60 mL/min (ref >60)
Glucose, Bld: 101 mg/dL — ABNORMAL HIGH (ref 70–99)
Potassium: 3.5 mmol/L (ref 3.5–5.1)
Sodium: 143 mmol/L (ref 135–145)
Total Bilirubin: 0.9 mg/dL (ref 0.3–1.2)
Total Protein: 7.7 g/dL (ref 6.5–8.1)

## 2023-01-25 LAB — RESP PANEL BY RT-PCR (RSV, FLU A&B, COVID)  RVPGX2
Influenza A by PCR: NEGATIVE
Influenza B by PCR: NEGATIVE
Resp Syncytial Virus by PCR: NEGATIVE
SARS Coronavirus 2 by RT PCR: NEGATIVE

## 2023-01-25 MED ORDER — DIPHENHYDRAMINE HCL 50 MG/ML IJ SOLN
25.0000 mg | Freq: Once | INTRAMUSCULAR | Status: AC
  Administered 2023-01-25: 25 mg via INTRAVENOUS
  Filled 2023-01-25: qty 1

## 2023-01-25 MED ORDER — PROCHLORPERAZINE EDISYLATE 10 MG/2ML IJ SOLN
10.0000 mg | Freq: Once | INTRAMUSCULAR | Status: AC
  Administered 2023-01-25: 10 mg via INTRAVENOUS
  Filled 2023-01-25: qty 2

## 2023-01-25 MED ORDER — SODIUM CHLORIDE 0.9 % IV BOLUS
1000.0000 mL | Freq: Once | INTRAVENOUS | Status: AC
  Administered 2023-01-25: 1000 mL via INTRAVENOUS

## 2023-01-25 MED ORDER — KETOROLAC TROMETHAMINE 15 MG/ML IJ SOLN
15.0000 mg | Freq: Once | INTRAMUSCULAR | Status: AC
  Administered 2023-01-25: 15 mg via INTRAVENOUS
  Filled 2023-01-25: qty 1

## 2023-01-25 NOTE — Discharge Instructions (Signed)
Note the workup today was overall reassuring.  As discussed, laboratory and imaging studies were without abnormality.  Recommend follow-up with neurology given chronic headaches.  Please not hesitate to return to emergency department if worrisome signs and symptoms we discussed become apparent.

## 2023-01-25 NOTE — ED Triage Notes (Signed)
Patient here POV from Home.  Endorses Headache to posterior Head that began 2 Days ago and has remained Consistent since.  No Photosensitivity. Some N/V. No Fevers.   NAD Noted during Triage. A&Ox4. Gcs 15. Ambulatory.
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
## 2023-05-11 ENCOUNTER — Emergency Department (HOSPITAL_BASED_OUTPATIENT_CLINIC_OR_DEPARTMENT_OTHER)
Admission: EM | Admit: 2023-05-11 | Discharge: 2023-05-11 | Disposition: A | Discharge status: Home / Self Care | Payer: Commercial Managed Care - PPO | Attending: Emergency Medicine | Admitting: Emergency Medicine

## 2023-05-11 ENCOUNTER — Other Ambulatory Visit: Payer: Self-pay

## 2023-05-11 ENCOUNTER — Other Ambulatory Visit (HOSPITAL_BASED_OUTPATIENT_CLINIC_OR_DEPARTMENT_OTHER): Payer: Self-pay

## 2023-05-11 ENCOUNTER — Encounter (HOSPITAL_BASED_OUTPATIENT_CLINIC_OR_DEPARTMENT_OTHER): Payer: Self-pay | Admitting: Emergency Medicine

## 2023-05-11 ENCOUNTER — Emergency Department (HOSPITAL_BASED_OUTPATIENT_CLINIC_OR_DEPARTMENT_OTHER): Payer: Commercial Managed Care - PPO

## 2023-05-11 DIAGNOSIS — K640 First degree hemorrhoids: Secondary | ICD-10-CM | POA: Diagnosis not present

## 2023-05-11 DIAGNOSIS — R519 Headache, unspecified: Secondary | ICD-10-CM | POA: Diagnosis not present

## 2023-05-11 DIAGNOSIS — R1010 Upper abdominal pain, unspecified: Secondary | ICD-10-CM | POA: Diagnosis present

## 2023-05-11 HISTORY — DX: Essential (primary) hypertension: I10

## 2023-05-11 LAB — COMPREHENSIVE METABOLIC PANEL
ALT: 15 U/L (ref 0–44)
AST: 26 U/L (ref 15–41)
Albumin: 4.4 g/dL (ref 3.5–5.0)
Alkaline Phosphatase: 57 U/L (ref 38–126)
Anion gap: 15 (ref 5–15)
BUN: 13 mg/dL (ref 6–20)
CO2: 25 mmol/L (ref 22–32)
Calcium: 9.5 mg/dL (ref 8.9–10.3)
Chloride: 100 mmol/L (ref 98–111)
Creatinine, Ser: 0.71 mg/dL (ref 0.61–1.24)
GFR, Estimated: >60 mL/min (ref >60)
Glucose, Bld: 76 mg/dL (ref 70–99)
Potassium: 4.2 mmol/L (ref 3.5–5.1)
Sodium: 140 mmol/L (ref 135–145)
Total Bilirubin: 1.1 mg/dL (ref 0.3–1.2)
Total Protein: 7.7 g/dL (ref 6.5–8.1)

## 2023-05-11 LAB — CBC WITH DIFFERENTIAL/PLATELET
Abs Immature Granulocytes: 0.01 10*3/uL (ref 0.00–0.07)
Basophils Absolute: 0 10*3/uL (ref 0.0–0.1)
Basophils Relative: 0 %
Eosinophils Absolute: 0 10*3/uL (ref 0.0–0.5)
Eosinophils Relative: 1 %
HCT: 40.6 % (ref 39.0–52.0)
Hemoglobin: 13.8 g/dL (ref 13.0–17.0)
Immature Granulocytes: 0 %
Lymphocytes Relative: 24 %
Lymphs Abs: 1.6 10*3/uL (ref 0.7–4.0)
MCH: 31.2 pg (ref 26.0–34.0)
MCHC: 34 g/dL (ref 30.0–36.0)
MCV: 91.9 fL (ref 80.0–100.0)
Monocytes Absolute: 0.5 10*3/uL (ref 0.1–1.0)
Monocytes Relative: 7 %
Neutro Abs: 4.5 10*3/uL (ref 1.7–7.7)
Neutrophils Relative %: 68 %
Platelets: 282 10*3/uL (ref 150–400)
RBC: 4.42 MIL/uL (ref 4.22–5.81)
RDW: 16.3 % — ABNORMAL HIGH (ref 11.5–15.5)
WBC: 6.6 10*3/uL (ref 4.0–10.5)
nRBC: 0 % (ref 0.0–0.2)

## 2023-05-11 LAB — OCCULT BLOOD X 1 CARD TO LAB, STOOL: Fecal Occult Bld: POSITIVE — AB

## 2023-05-11 LAB — LIPASE, BLOOD: Lipase: 27 U/L (ref 11–51)

## 2023-05-11 MED ORDER — SUCRALFATE 1 GM/10ML PO SUSP: 1.0000 g | Freq: Three times a day (TID) | ORAL | Status: DC

## 2023-05-11 MED ORDER — ACETAMINOPHEN 500 MG PO TABS
1000.0000 mg | ORAL_TABLET | Freq: Four times a day (QID) | ORAL | Status: DC | PRN
  Administered 2023-05-11: 1000 mg via ORAL
  Filled 2023-05-11: qty 2

## 2023-05-11 MED ORDER — IOHEXOL 300 MG/ML  SOLN
100.0000 mL | Freq: Once | INTRAMUSCULAR | Status: AC | PRN
  Administered 2023-05-11: 80 mL via INTRAVENOUS

## 2023-05-11 MED ORDER — FAMOTIDINE 20 MG PO TABS
20.0000 mg | ORAL_TABLET | Freq: Two times a day (BID) | ORAL | 0 refills | Status: DC
  Filled 2023-05-11: qty 20, 10d supply, fill #0

## 2023-05-11 MED ORDER — ONDANSETRON 4 MG PO TBDP
4.0000 mg | ORAL_TABLET | Freq: Three times a day (TID) | ORAL | Status: DC | PRN
  Administered 2023-05-11: 4 mg via ORAL
  Filled 2023-05-11: qty 1

## 2023-05-11 MED ORDER — FAMOTIDINE IN NACL 20-0.9 MG/50ML-% IV SOLN
20.0000 mg | INTRAVENOUS | Status: AC
  Administered 2023-05-11: 20 mg via INTRAVENOUS
  Filled 2023-05-11: qty 50

## 2023-05-11 NOTE — ED Triage Notes (Signed)
Pt arrives pov, steady gait with c/o upper ABD pain and bloody stool x 2 days. Endorses hx of hemorrhoids. Also reports posterior HA "for awhile". Reports had CT or MRI that was neg re: HA.

## 2023-05-11 NOTE — ED Provider Notes (Signed)
[place] EMERGENCY DEPARTMENT AT [facility] Provider Note   CSN: [id number] Arrival date & time: 05/11/23  4098     History  Chief Complaint  Patient presents with   Abdominal Pain    Bobby Holmes is a 47 y.o. male who presents to ED with multiple complaints.  Patient complaining of posterior headache that has been present for 5 months. Patient stating that he had imaging of head in February 2024 that was negative. Patient stating that headache feels like "tension" and also complains of feeling like "he is on an elevator" intermittently. Denies syncope/loss of consciousness, changes in vision/hearing, gait abnormalities, extremity numbness/paresthesias, head trauma, vomiting, seizures. Symptoms have not changed since his last imaging in February 2024.  Patient also complaining of intermittent cramping upper abdominal pain x1 week. Patient went to bathroom at work - saw "a lot" of blood in toilet. Hx of hemrroids. Same symptom has happened in past with f/u colonoscopy that was negative. Patient endorses recent constipation this past week - last BM this morning. Denies nausea, vomiting, hematuria, dysuria, pain radiation, rectal pain, lightheadedness, palpitations, chest pain, dyspnea. No past abdominal surgeries.    Abdominal Pain      Home Medications Prior to Admission medications   Medication Sig Start Date End Date Taking? Authorizing Provider  famotidine (PEPCID) 20 MG tablet Take 1 tablet (20 mg total) by mouth 2 (two) times daily for 10 days. 05/11/23 05/21/23 Yes Kileigh Ortmann, Charlotte Sanes F, PA-C  amLODipine (NORVASC) 5 MG tablet Take 5 mg by mouth daily. 11/11/22   [provider]  cyclobenzaprine (FLEXERIL) 10 MG tablet Take 1 tablet (10 mg total) by mouth 2 (two) times daily as needed for muscle spasms. 09/12/20   Eber Hong, MD  ibuprofen (ADVIL,MOTRIN) 200 MG tablet Take 400 mg by mouth every 6 (six) hours as needed for moderate pain.    [provider]  meloxicam (MOBIC) 15 MG tablet Take 15 mg by mouth daily. 01/14/23   [provider]  omeprazole (PRILOSEC) 20 MG capsule Take 20 mg by mouth daily. 11/11/22   [provider]  ondansetron (ZOFRAN-ODT) 8 MG disintegrating tablet Take 1 tablet (8 mg total) by mouth every 8 (eight) hours as needed for nausea or vomiting. 12/28/21   Linwood Dibbles, MD      Allergies    Patient has no known allergies.    Review of Systems   Review of Systems  Gastrointestinal:  Positive for abdominal pain.    Physical Exam Updated Vital Signs BP (!) 139/99   Pulse 66   Temp 98.9 F (37.2 C) (Oral)   Resp 18   Ht 5\' 10"  (1.778 m)   Wt 76.2 kg   SpO2 98%   BMI 24.11 kg/m  Physical Exam Vitals and nursing note reviewed.  Constitutional:      General: He is not in acute distress.    Appearance: He is not ill-appearing, toxic-appearing or diaphoretic.  HENT:     Head: Normocephalic and atraumatic.     Mouth/Throat:     Mouth: Mucous membranes are moist.     Pharynx: No posterior oropharyngeal erythema.  Eyes:     General: No scleral icterus.       Right eye: No discharge.        Left eye: No discharge.     Conjunctiva/sclera: Conjunctivae normal.  Cardiovascular:     Rate and Rhythm: Normal rate and regular rhythm.     Pulses: Normal pulses.  Heart sounds: No murmur heard. Pulmonary:     Effort: Pulmonary effort is normal. No respiratory distress.     Breath sounds: Normal breath sounds. No wheezing, rhonchi or rales.  Abdominal:     General: Abdomen is flat. Bowel sounds are normal. There is no distension.     Palpations: Abdomen is soft. There is no mass.     Tenderness: There is no abdominal tenderness. There is no right CVA tenderness or left CVA tenderness.     Hernia: No hernia is present.  Musculoskeletal:     Right lower leg: No edema.     Left lower leg: No edema.  Skin:    General: Skin is warm and dry.     Capillary Refill: Capillary refill  takes less than 2 seconds.     Findings: No rash.  Neurological:     General: No focal deficit present.     Mental Status: He is alert. Mental status is at baseline.     Comments: GCS 15. Speech is goal oriented. No deficits appreciated to CN III-XII; symmetric eyebrow raise, no facial drooping, tongue midline. Patient has equal grip strength bilaterally with 5/5 strength against resistance in all major muscle groups bilaterally. Sensation to light touch intact. Patient moves extremities without ataxia. Normal finger-nose-finger.    Psychiatric:        Mood and Affect: Mood normal.     ED Results / Procedures / Treatments   Labs (all labs ordered are listed, but only abnormal results are displayed) Labs Reviewed  CBC WITH DIFFERENTIAL/PLATELET - Abnormal; Notable for the following components:      Result Value   RDW 16.3 (*)    All other components within normal limits  OCCULT BLOOD X 1 CARD TO LAB, STOOL - Abnormal; Notable for the following components:   Fecal Occult Bld POSITIVE (*)    All other components within normal limits  COMPREHENSIVE METABOLIC PANEL  LIPASE, BLOOD    EKG EKG Interpretation  Date/Time:  Wednesday May 11 2023 11:00:39 EDT Ventricular Rate:  84 PR Interval:  185 QRS Duration: 96 QT Interval:  432 QTC Calculation: 511 R Axis:   -82 Text Interpretation: Sinus rhythm Ventricular premature complex  borderline left axis deviation Anterior infarct, old Prolonged QT interval  Confirmed by Alvino Blood ([id number]) on 05/11/2023 12:31:41 PM  Radiology CT ABDOMEN PELVIS W CONTRAST  Result Date: 05/11/2023 CLINICAL DATA:  Abdominal pain, acute, nonlocalized. Upper abdominal pain. EXAM: CT ABDOMEN AND PELVIS WITH CONTRAST TECHNIQUE: Multidetector CT imaging of the abdomen and pelvis was performed using the standard protocol following bolus administration of intravenous contrast. RADIATION DOSE REDUCTION: This exam was performed according to the departmental  dose-optimization program which includes automated exposure control, adjustment of the mA and/or kV according to patient size and/or use of iterative reconstruction technique. CONTRAST:  80mL OMNIPAQUE IOHEXOL 300 MG/ML  SOLN COMPARISON:  CTA abdomen/pelvis 11/10/2022. FINDINGS: Lower chest: No acute abnormality. Hepatobiliary: Hepatic steatosis focal fat along the falciform ligament. Gallbladder is unremarkable. No biliary dilatation. Pancreas: Unremarkable. No pancreatic ductal dilatation or surrounding inflammatory changes. Spleen: Normal. Adrenals/Urinary Tract: Adrenal glands are unremarkable. Kidneys are normal, without renal calculi, focal lesion, or hydronephrosis. Bladder is unremarkable. Stomach/Bowel: Normal stomach and duodenum. No dilated loops of small bowel. Normal appendix is visualized on axial image 41 series 2. No bowel wall thickening or surrounding inflammation. Vascular/Lymphatic: Aortic atherosclerosis. No enlarged abdominal or pelvic lymph nodes. Reproductive: Prostate is unremarkable. Other: No abdominal wall hernia or abnormality.  No abdominopelvic ascites. Musculoskeletal: No acute or significant osseous findings. IMPRESSION: 1. No acute abnormality of the abdomen or pelvis. 2. Hepatic steatosis. Aortic Atherosclerosis (ICD10-I70.0). Electronically Signed   By: Orvan Falconer M.D.   On: 05/11/2023 14:09    Procedures Procedures    Medications Ordered in ED Medications  famotidine (PEPCID) IVPB 20 mg premix (0 mg Intravenous Stopped 05/11/23 1427)  iohexol (OMNIPAQUE) 300 MG/ML solution 100 mL (80 mLs Intravenous Contrast Given 05/11/23 1339)    ED Course/ Medical Decision Making/ A&P                             Medical Decision Making Amount and/or Complexity of Data Reviewed Labs: ordered. Radiology: ordered.  Risk Prescription drug management.    This patient presents to the ED for concern of abdominal pain, posterior headache, and blood in stool, this involves  an extensive number of treatment options, and is a complaint that carries with it a high risk of complications and morbidity.  The differential diagnosis includes gastroenteritis, colitis, small bowel obstruction, appendicitis, cholecystitis, pancreatitis, nephrolithiasis, UTI, pyleonephritis   Co morbidities that complicate the patient evaluation  none   Additional history obtained:  Additional history obtained from 01/2023 CT head -> Normal head CT.    Lab Tests/Imaging:  I Ordered, and personally interpreted labs.  The pertinent results include:   CBC with differential: No concern for anemia or leukocytosis CMP: no concern for electrolyte abnormality; no concern for kidney/liver damage Lipase: within normal limits Fecal occult: positive  CT abdomen/pelvis: No acute abnormality of the abdomen or pelvis. Hepatic steatosis.    Cardiac Monitoring: / EKG:  The patient was maintained on a cardiac monitor.  I personally viewed and interpreted the cardiac monitored which showed an underlying rhythm of: sinus rhythm with ventricular premature complexes. No acute ST changes.    Problem List / ED Course / Critical interventions / Medication management  Patient presented for abdominal pain and hematochezia.  Rectal exam appreciated multiple external hemorrhoids-Hemoccult positive.  No internal hemorrhoids palpated.  Patient denying severe pain with rectal exam.  Rest of physical exam unremarkable.  I believe patient's hematochezia is due to to his hemorrhoids given his past history of hemorrhoids and recent constipation.  Patient without fever, lower abdominal pain, or CT findings -less concerning for diverticulitis.  Lipase within normal limits-less concerning for pancreatitis.  No leukocytosis or anemia on CBC.  CMP reassuring. Patient with a past history of GERD.  Gave patient Pepcid and Zofran in the ED which patient states helped resolved his abdominal discomfort.  Will discharge  patient with Pepcid for his acid reflux causing upper abdominal discomfort.  As for patient's headache, CT scan in February was without concern.  Patient symptoms are the same as they were in February.  Neuroexam unremarkable.  Normal ROM of neck.  Patient denies seizures, loss of consciousness, nausea, vomiting, recent head trauma.  Patient describing that his headaches feel like tension back of his head.  Patient denying any visual complaints.  I am most concerned that patient's headaches are due to tension headaches.  I educated patient that his primary care provider might want to refer him to a neurologist to help prevent headaches.  Educated patient that headaches can work be resolved with Tylenol and Ibuprofen. Patient stating that he has a primary care appointment 6/18.  Treated patient that his PVCs should not be causing him any symptoms, but his primary care  might want to have follow-up with cardiology referral. I have reviewed the patients home medicines and have made adjustments as needed Patient was given return precautions. Patient stable for discharge at this time.  Patient verbalized understanding of plan.   Social Determinants of Health:  none           Final Clinical Impression(s) / ED Diagnoses Final diagnoses:  Grade I hemorrhoids    Rx / DC Orders ED Discharge Orders          Ordered    famotidine (PEPCID) 20 MG tablet  2 times daily        05/11/23 1524              Dorthy Cooler, [place] 05/12/23 1104    Lonell Grandchild, MD 05/13/23 [phone]

## 2023-05-11 NOTE — Discharge Instructions (Addendum)
A pleasure caring for you today.  Lab work was reassuring.  CT scan does not show any anything that would explain your rectal bleeding or upper abdominal discomfort.  I believe the most recent rectal bleeding is due to your internal/external hemorrhoids.  I recommend following up with the GI doctor and possibly undergoing a rubber band ligation treatment for hemorrhoids.   I am discharging you with a prescription for Pepcid which should help further resolve your abdominal discomfort.  Your EKG did show premature ventricular contractions.  This finding should not be giving you any symptoms but your primary care might want to further follow-up on this finding.  I also recommend following up with neurology for your headaches.  Seek emergency care if experiencing any new or worsening symptoms such as loss of consciousness, severe headache, severe hemorrhagic rectal bleeding.

## 2023-05-11 NOTE — ED Notes (Signed)
Returns from CT. Nausea improved. No new complaints at this time.

## 2023-05-20 ENCOUNTER — Other Ambulatory Visit (HOSPITAL_BASED_OUTPATIENT_CLINIC_OR_DEPARTMENT_OTHER): Payer: Self-pay

## 2023-05-31 ENCOUNTER — Ambulatory Visit: Payer: Commercial Managed Care - PPO | Admitting: Family

## 2023-05-31 NOTE — Progress Notes (Deleted)
   New Patient Office Visit  Subjective:  Patient ID: Bobby Holmes, male    DOB: 02-20-1976  Age: 47 y.o. MRN: [id number]  CC: No chief complaint on file.   HPI Bobby Holmes presents for establishing care today.  Assessment & Plan:  There are no diagnoses linked to this encounter.  Subjective:    Outpatient Medications Prior to Visit  Medication Sig Dispense Refill   amLODipine (NORVASC) 5 MG tablet Take 5 mg by mouth daily.     cyclobenzaprine (FLEXERIL) 10 MG tablet Take 1 tablet (10 mg total) by mouth 2 (two) times daily as needed for muscle spasms. 20 tablet 0   famotidine (PEPCID) 20 MG tablet Take 1 tablet (20 mg total) by mouth 2 (two) times daily for 10 days. 20 tablet 0   ibuprofen (ADVIL,MOTRIN) 200 MG tablet Take 400 mg by mouth every 6 (six) hours as needed for moderate pain.     meloxicam (MOBIC) 15 MG tablet Take 15 mg by mouth daily.     omeprazole (PRILOSEC) 20 MG capsule Take 20 mg by mouth daily.     ondansetron (ZOFRAN-ODT) 8 MG disintegrating tablet Take 1 tablet (8 mg total) by mouth every 8 (eight) hours as needed for nausea or vomiting. 12 tablet 0   No facility-administered medications prior to visit.   Past Medical History:  Diagnosis Date   Arthritis    Hypertension    Mandibular fracture [facility])    Past Surgical History:  Procedure Laterality Date   KNEE SURGERY     MANDIBULAR HARDWARE REMOVAL Left 05/27/2016   Procedure: REMOVAL OF MANDIBLE FRACTURE HARDWARE AND OF TOOTH FRAGMENT NUMBER 16,  EXCISE LEFT NASAL SYNECHIAE and insertion of nasal silastic guard;  Surgeon: Flo Shanks, MD;  Location: [facility] OR;  Service: ENT;  Laterality: Left;   NASAL-PHARYNGEAL APPLICATOR INSERTION  01/29/2016   Procedure: INSERTION OF NASAL-PHARYNGEAL APPLICATOR;  Surgeon: Flo Shanks, MD;  Location: [facility] OR;  Service: ENT;;   ORIF MANDIBULAR FRACTURE N/A 01/29/2016   Procedure:  MANDIBULAR AND MAXILLARY FIXATION, POSSIBLE NASAL PACKING;  Surgeon: Flo Shanks, MD;   Location: [facility] OR;  Service: ENT;  Laterality: N/A;   TRACHEOSTOMY TUBE PLACEMENT N/A 01/29/2016   Procedure: TRACHEOSTOMY;  Surgeon: Flo Shanks, MD;  Location: [facility] OR;  Service: ENT;  Laterality: N/A;    Objective:   Today's Vitals: There were no vitals taken for this visit.  Physical Exam Vitals and nursing note reviewed.  Constitutional:      General: He is not in acute distress.    Appearance: Normal appearance.  HENT:     Head: Normocephalic.  Cardiovascular:     Rate and Rhythm: Normal rate and regular rhythm.  Pulmonary:     Effort: Pulmonary effort is normal.     Breath sounds: Normal breath sounds.  Musculoskeletal:        General: Normal range of motion.     Cervical back: Normal range of motion.  Skin:    General: Skin is warm and dry.  Neurological:     Mental Status: He is alert and oriented to person, place, and time.  Psychiatric:        Mood and Affect: Mood normal.     No orders of the defined types were placed in this encounter.   Dulce Sellar, NP

## 2023-08-02 ENCOUNTER — Other Ambulatory Visit: Payer: Self-pay

## 2023-08-02 ENCOUNTER — Emergency Department (HOSPITAL_BASED_OUTPATIENT_CLINIC_OR_DEPARTMENT_OTHER)
Admission: EM | Admit: 2023-08-02 | Discharge: 2023-08-02 | Disposition: A | Discharge status: Home / Self Care | Payer: 59 | Attending: Emergency Medicine | Admitting: Emergency Medicine

## 2023-08-02 ENCOUNTER — Encounter (HOSPITAL_BASED_OUTPATIENT_CLINIC_OR_DEPARTMENT_OTHER): Payer: Self-pay | Admitting: Emergency Medicine

## 2023-08-02 DIAGNOSIS — H1031 Unspecified acute conjunctivitis, right eye: Secondary | ICD-10-CM | POA: Diagnosis not present

## 2023-08-02 DIAGNOSIS — Z79899 Other long term (current) drug therapy: Secondary | ICD-10-CM | POA: Diagnosis not present

## 2023-08-02 DIAGNOSIS — H1033 Unspecified acute conjunctivitis, bilateral: Secondary | ICD-10-CM | POA: Diagnosis not present

## 2023-08-02 DIAGNOSIS — H5711 Ocular pain, right eye: Secondary | ICD-10-CM | POA: Diagnosis not present

## 2023-08-02 MED ORDER — TOBRAMYCIN 0.3 % OP SOLN
2.0000 [drp] | Freq: Once | OPHTHALMIC | Status: AC
  Administered 2023-08-02: 2 [drp] via OPHTHALMIC
  Filled 2023-08-02: qty 5

## 2023-08-02 NOTE — ED Provider Notes (Signed)
[place] EMERGENCY DEPARTMENT AT [facility] Provider Note   CSN: [id number] Arrival date & time: 08/02/23  0037     History  Chief Complaint  Patient presents with   Eye Pain    Bobby Holmes is a 47 y.o. male.  Patient is a 47 year old male presenting with complaints of burning, redness, itching and watering from the right eye.  This has been worsening over the past few days.  Discomfort is worse with bright lights making it difficult for him to work.  He denies any injury or trauma.  No grinding metal or welding.  The history is provided by the patient.       Home Medications Prior to Admission medications   Medication Sig Start Date End Date Taking? Authorizing Provider  amLODipine (NORVASC) 5 MG tablet Take 5 mg by mouth daily. 11/11/22   [provider]  cyclobenzaprine (FLEXERIL) 10 MG tablet Take 1 tablet (10 mg total) by mouth 2 (two) times daily as needed for muscle spasms. 09/12/20   Eber Hong, MD  famotidine (PEPCID) 20 MG tablet Take 1 tablet (20 mg total) by mouth 2 (two) times daily for 10 days. 05/11/23 05/21/23  Dorthy Cooler, PA-C  ibuprofen (ADVIL,MOTRIN) 200 MG tablet Take 400 mg by mouth every 6 (six) hours as needed for moderate pain.    [provider]  meloxicam (MOBIC) 15 MG tablet Take 15 mg by mouth daily. 01/14/23   [provider]  omeprazole (PRILOSEC) 20 MG capsule Take 20 mg by mouth daily. 11/11/22   [provider]  ondansetron (ZOFRAN-ODT) 8 MG disintegrating tablet Take 1 tablet (8 mg total) by mouth every 8 (eight) hours as needed for nausea or vomiting. 12/28/21   Linwood Dibbles, MD      Allergies    Patient has no known allergies.    Review of Systems   Review of Systems  All other systems reviewed and are negative.   Physical Exam Updated Vital Signs BP (!) 164/112   Pulse 78   Temp 98.5 F (36.9 C) (Oral)   Resp 18   Wt 76.2 kg   SpO2 98%   BMI 24.10 kg/m  Physical  Exam Vitals and nursing note reviewed.  Constitutional:      Appearance: Normal appearance.  Eyes:     Comments: The right conjunctiva is injected.  There is clear drainage coming from the eye.  The cornea itself is normal in appearance and anterior chamber is clear.  Pupil is reactive.  Pulmonary:     Effort: Pulmonary effort is normal.  Skin:    General: Skin is warm and dry.  Neurological:     Mental Status: He is alert.     ED Results / Procedures / Treatments   Labs (all labs ordered are listed, but only abnormal results are displayed) Labs Reviewed - No data to display  EKG None  Radiology No results found.  Procedures Procedures    Medications Ordered in ED Medications  tobramycin (TOBREX) 0.3 % ophthalmic solution 2 drop (has no administration in time range)    ED Course/ Medical Decision Making/ A&P  This appears to be a conjunctivitis for which antibiotic drops will be prescribed.  If he is not improving in the next few days, he has to follow-up with ophthalmology.  Final Clinical Impression(s) / ED Diagnoses Final diagnoses:  None    Rx / DC Orders ED Discharge Orders     None  Geoffery Lyons, MD 08/02/23 [phone]

## 2023-08-02 NOTE — ED Triage Notes (Signed)
Pt in with R eye pain and light sensitivity x 3 days. Pt states the same is now happening to his L eye, and it is hard for him to drive at night due to the car headlights. Pain described as "nerve pain"

## 2023-08-02 NOTE — Discharge Instructions (Signed)
Begin using tobramycin eyedrops, 2 drops in each eye 4 times daily for the next 5 days.  If symptoms or not improving or worsen in the next 2 days, follow-up with ophthalmology.  The contact information for Dr. Nada Libman has been provided in this discharge summary for you to call and make these arrangements.

## 2024-02-19 ENCOUNTER — Emergency Department (HOSPITAL_BASED_OUTPATIENT_CLINIC_OR_DEPARTMENT_OTHER)
Admission: EM | Admit: 2024-02-19 | Discharge: 2024-02-19 | Disposition: A | Discharge status: Home / Self Care | Payer: Self-pay | Attending: Emergency Medicine | Admitting: Emergency Medicine

## 2024-02-19 ENCOUNTER — Other Ambulatory Visit: Payer: Self-pay

## 2024-02-19 DIAGNOSIS — Z79899 Other long term (current) drug therapy: Secondary | ICD-10-CM | POA: Insufficient documentation

## 2024-02-19 DIAGNOSIS — I10 Essential (primary) hypertension: Secondary | ICD-10-CM | POA: Insufficient documentation

## 2024-02-19 DIAGNOSIS — R040 Epistaxis: Secondary | ICD-10-CM | POA: Insufficient documentation

## 2024-02-19 MED ORDER — OXYMETAZOLINE HCL 0.05 % NA SOLN
1.0000 | Freq: Once | NASAL | Status: AC
  Administered 2024-02-19: 1 via NASAL
  Filled 2024-02-19: qty 30

## 2024-02-19 MED ORDER — LIDOCAINE VISCOUS HCL 2 % MT SOLN
15.0000 mL | Freq: Once | OROMUCOSAL | Status: AC
  Administered 2024-02-19: 15 mL via OROMUCOSAL
  Filled 2024-02-19: qty 15

## 2024-02-19 MED ORDER — SILVER NITRATE-POT NITRATE 75-25 % EX MISC
CUTANEOUS | Status: AC
  Filled 2024-02-19: qty 10

## 2024-02-19 NOTE — ED Triage Notes (Signed)
 Has had epistaxis since Friday on and off. Today nose has been bleeding since this morning. No thinners.

## 2024-02-19 NOTE — ED Provider Notes (Signed)
 [place] EMERGENCY DEPARTMENT AT [facility] Provider Note   CSN: [id number] Arrival date & time: 02/19/24  1751     History  Chief Complaint  Patient presents with   Epistaxis    Bobby Holmes is a 48 y.o. male with hypertension presents with complaints of epistaxis.  Patient reports bleeding has been on and off for the past few days.  Denies any injury or trauma.  Denies any pain.  He states he feels that blood is going down his throat and his spit up blood.  He is not on blood thinners.  Has no known bleeding disorders.  Patient does report prior history of systemic nosebleed years ago.   Epistaxis     Past Medical History:  Diagnosis Date   Arthritis    Hypertension    Mandibular fracture (HCC)      Home Medications Prior to Admission medications   Medication Sig Start Date End Date Taking? Authorizing Provider  amLODipine (NORVASC) 5 MG tablet Take 5 mg by mouth daily. 11/11/22   [provider]  cyclobenzaprine (FLEXERIL) 10 MG tablet Take 1 tablet (10 mg total) by mouth 2 (two) times daily as needed for muscle spasms. 09/12/20   Eber Hong, MD  famotidine (PEPCID) 20 MG tablet Take 1 tablet (20 mg total) by mouth 2 (two) times daily for 10 days. 05/11/23 05/21/23  Dorthy Cooler, PA-C  ibuprofen (ADVIL,MOTRIN) 200 MG tablet Take 400 mg by mouth every 6 (six) hours as needed for moderate pain.    [provider]  meloxicam (MOBIC) 15 MG tablet Take 15 mg by mouth daily. 01/14/23   [provider]  omeprazole (PRILOSEC) 20 MG capsule Take 20 mg by mouth daily. 11/11/22   [provider]  ondansetron (ZOFRAN-ODT) 8 MG disintegrating tablet Take 1 tablet (8 mg total) by mouth every 8 (eight) hours as needed for nausea or vomiting. 12/28/21   Linwood Dibbles, MD      Allergies    Patient has no known allergies.    Review of Systems   Review of Systems  HENT:  Positive for nosebleeds.     Physical Exam Updated Vital  Signs BP (!) 139/106   Pulse 84   Temp 98.6 F (37 C) (Oral)   Resp 20   SpO2 98%  Physical Exam Vitals and nursing note reviewed.  Constitutional:      General: He is not in acute distress.    Appearance: He is well-developed.  HENT:     Head: Normocephalic and atraumatic.     Nose:     Comments: Dried blood in bilateral nares, hemostasis appears achieved without any current flow.  Septum midline.  No evidence of polyps, masses or trauma. Eyes:     Conjunctiva/sclera: Conjunctivae normal.  Cardiovascular:     Rate and Rhythm: Normal rate and regular rhythm.     Heart sounds: No murmur heard. Pulmonary:     Effort: Pulmonary effort is normal. No respiratory distress.     Breath sounds: Normal breath sounds. No wheezing or rales.  Musculoskeletal:     Cervical back: Neck supple.  Skin:    General: Skin is warm and dry.     Capillary Refill: Capillary refill takes less than 2 seconds.  Neurological:     Mental Status: He is alert.  Psychiatric:        Mood and Affect: Mood normal.     ED Results / Procedures / Treatments   Labs (all labs  ordered are listed, but only abnormal results are displayed) Labs Reviewed - No data to display  EKG None  Radiology No results found.  Procedures Procedures    Medications Ordered in ED Medications  silver nitrate applicators 75-25 % applicator (has no administration in time range)  oxymetazoline (AFRIN) 0.05 % nasal spray 1 spray (1 spray Each Nare Given 02/19/24 2019)  lidocaine (XYLOCAINE) 2 % viscous mouth solution 15 mL (15 mLs Mouth/Throat Given by Other 02/19/24 2119)    ED Course/ Medical Decision Making/ A&P                                 Medical Decision Making Risk OTC drugs. Prescription drug management.   This patient presents to the ED with chief complaint(s) of epistaxis .  The complaint involves an extensive differential diagnosis and also carries with it a high risk of complications and morbidity.    pertinent past medical history as listed in HPI  The differential diagnosis includes  Anterior versus posterior epistaxis, trauma The initial plan is to  Will start with external pressure Additional history obtained: Records reviewed Care Everywhere/External Records  Initial Assessment:   Patient presents hypertensive to 162/105 with complaints of persistent epistaxis over the past few days.  Patient does have a history of hypertension but is noncompliant with medication.  Denies any injury or trauma to the nose.  Reports that he has bilateral nare epistaxis including down his throat.  Endorses hemoptysis.  Independent ECG interpretation:  none  Independent labs interpretation:  The following labs were independently interpreted:  none  Independent visualization and interpretation of imaging: none  Treatment and Reassessment: Direct pressure applied since triage Following first assessment Afrin sprayed and direct pressure applied again With persistent symptoms.  Bilateral gauze soaked in viscous lidocaine placed with direct pressure. Some mild improvement.  Silver nitrate applied to mild bleeder.  No bleeding at this time.  Discussed if symptoms persist would repeat with Afrin and or Rhino Rocket.  Patient declines interest in either.  He declines interest in any further silver nitrate applications.  He would like discharge at this time.  Consultations obtained:   none  Disposition:   Patient discharged home with ENT referral.  The patient has been appropriately medically screened and/or stabilized in the ED. I have low suspicion for any other emergent medical condition which would require further screening, evaluation or treatment in the ED or require inpatient management. At time of discharge the patient is hemodynamically stable and in no acute distress. I have discussed work-up results and diagnosis with patient and answered all questions. Patient is agreeable with discharge plan.  We discussed strict return precautions for returning to the emergency department and they verbalized understanding.     Social Determinants of Health:   none  This note was dictated with voice recognition software.  Despite best efforts at proofreading, errors may have occurred which can change the documentation meaning.          Final Clinical Impression(s) / ED Diagnoses Final diagnoses:  Epistaxis    Rx / DC Orders ED Discharge Orders     None         Halford Decamp, PA-C 02/19/24 2301    Franne Forts, DO 02/20/24 0008

## 2024-02-19 NOTE — ED Notes (Signed)
 PT reports hx of HTN but non-compliant with medications.

## 2024-02-19 NOTE — Discharge Instructions (Addendum)
 You were evaluated in the emergency room for a nosebleed.  He will provided a referral for a local ENT doctor.  Please call make an appointment earliest convenience.  If the bleeding returns please apply direct pressure as instructed.  If bleeding persists return to the emergency room.

## 2024-02-21 ENCOUNTER — Other Ambulatory Visit: Payer: Self-pay

## 2024-02-21 ENCOUNTER — Encounter (HOSPITAL_COMMUNITY): Payer: Self-pay

## 2024-02-21 ENCOUNTER — Emergency Department (HOSPITAL_COMMUNITY)
Admission: EM | Admit: 2024-02-21 | Discharge: 2024-02-21 | Disposition: A | Discharge status: Home / Self Care | Payer: Self-pay | Attending: Emergency Medicine | Admitting: Emergency Medicine

## 2024-02-21 DIAGNOSIS — I10 Essential (primary) hypertension: Secondary | ICD-10-CM | POA: Insufficient documentation

## 2024-02-21 DIAGNOSIS — R040 Epistaxis: Secondary | ICD-10-CM | POA: Insufficient documentation

## 2024-02-21 DIAGNOSIS — Z79899 Other long term (current) drug therapy: Secondary | ICD-10-CM | POA: Insufficient documentation

## 2024-02-21 MED ORDER — OXYMETAZOLINE HCL 0.05 % NA SOLN: 1.0000 | Freq: Once | NASAL | Status: DC

## 2024-02-21 MED ORDER — LIDOCAINE VISCOUS HCL 2 % MT SOLN
15.0000 mL | Freq: Once | OROMUCOSAL | Status: AC
  Administered 2024-02-21: 15 mL via OROMUCOSAL
  Filled 2024-02-21: qty 15

## 2024-02-21 NOTE — ED Triage Notes (Signed)
 Patient is here for evaluation of nose bleed. Reports has been bleeding since Friday. Denies taking any blood thinners.

## 2024-02-21 NOTE — ED Provider Triage Note (Signed)
 Emergency Medicine Provider Triage Evaluation Note  Bobby Holmes , a 48 y.o. male  was evaluated in triage.  Pt complains of epistaxis.  Review of Systems  Positive: Epistaxis Negative: Dizziness, lightheadedness, shortness of breath  Physical Exam  BP (!) 164/114 (BP Location: Left Arm)   Pulse 79   Temp 98.3 F (36.8 C) (Oral)   Resp 16   Ht 5\' 10"  (1.778 m)   Wt 76.2 kg   SpO2 100%   BMI 24.10 kg/m  Gen:   Awake, no distress   Resp:  Normal effort  MSK:   Moves extremities without difficulty  Other:  Bleeding present from left nares.  Medical Decision Making  Medically screening exam initiated at 12:56 PM.  Appropriate orders placed.  Bobby Holmes was informed that the remainder of the evaluation will be completed by another provider, this initial triage assessment does not replace that evaluation, and the importance of remaining in the ED until their evaluation is complete.  Patient presenting for recurrence of epistaxis.  This has been intermittent for the past 4 days.  Seen at [facility] 2 days ago.  Had recurrence of nosebleed this morning.  Currently it is left sided.  Patient refuses Afrin, stating that it burned his nasal passages when he was given it 2 days ago.  He was instructed to blow out clot and was given Kerlix for packing.  Packing placed and patient to hold pressure.   Gloris Manchester, MD 02/21/24 1257

## 2024-02-21 NOTE — ED Provider Notes (Signed)
 [place] EMERGENCY DEPARTMENT AT [facility] Provider Note   CSN: [id number] Arrival date & time: 02/21/24  1120     History  Chief Complaint  Patient presents with   Epistaxis    Bobby Holmes is a 48 y.o. male patient with past medical history of hypertension presenting to emergency room with epistaxis.  Patient reports he has had intermittent left nare bleeding since Friday.  He was seen in the emergency room on Friday and given Afrin, viscous lidocaine and the bleeding eventually stopped with these medicines.  He was offered nasal packing but declined.  Patient reports without any incident his no started spontaneously bleeding today.  He is not on blood thinners.  He has tried direct pressure.  Denies any dizziness lightheadedness or massive blood loss. \\  Epistaxis      Home Medications Prior to Admission medications   Medication Sig Start Date End Date Taking? Authorizing Provider  amLODipine (NORVASC) 5 MG tablet Take 5 mg by mouth daily. 11/11/22   [provider]  cyclobenzaprine (FLEXERIL) 10 MG tablet Take 1 tablet (10 mg total) by mouth 2 (two) times daily as needed for muscle spasms. 09/12/20   Eber Hong, MD  famotidine (PEPCID) 20 MG tablet Take 1 tablet (20 mg total) by mouth 2 (two) times daily for 10 days. 05/11/23 05/21/23  Dorthy Cooler, PA-C  ibuprofen (ADVIL,MOTRIN) 200 MG tablet Take 400 mg by mouth every 6 (six) hours as needed for moderate pain.    [provider]  meloxicam (MOBIC) 15 MG tablet Take 15 mg by mouth daily. 01/14/23   [provider]  omeprazole (PRILOSEC) 20 MG capsule Take 20 mg by mouth daily. 11/11/22   [provider]  ondansetron (ZOFRAN-ODT) 8 MG disintegrating tablet Take 1 tablet (8 mg total) by mouth every 8 (eight) hours as needed for nausea or vomiting. 12/28/21   Linwood Dibbles, MD      Allergies    Patient has no known allergies.    Review of Systems   Review of Systems   HENT:  Positive for nosebleeds.     Physical Exam Updated Vital Signs BP (!) 164/114 (BP Location: Left Arm)   Pulse 79   Temp 98.3 F (36.8 C) (Oral)   Resp 16   Ht 5\' 10"  (1.778 m)   Wt 76.2 kg   SpO2 100%   BMI 24.10 kg/m  Physical Exam Vitals and nursing note reviewed.  Constitutional:      General: He is not in acute distress.    Appearance: He is not toxic-appearing.  HENT:     Head: Normocephalic and atraumatic.     Nose:     Comments: Patient is bleeding out of bilateral nares.  When patient blows off clots left nare appears to have continued bleeding. Eyes:     General: No scleral icterus.    Conjunctiva/sclera: Conjunctivae normal.  Cardiovascular:     Rate and Rhythm: Normal rate and regular rhythm.     Pulses: Normal pulses.     Heart sounds: Normal heart sounds.  Pulmonary:     Effort: Pulmonary effort is normal. No respiratory distress.     Breath sounds: Normal breath sounds.  Abdominal:     General: Abdomen is flat. Bowel sounds are normal.     Palpations: Abdomen is soft.     Tenderness: There is no abdominal tenderness.  Skin:    General: Skin is warm and dry.     Findings:  No lesion.  Neurological:     General: No focal deficit present.     Mental Status: He is alert and oriented to person, place, and time. Mental status is at baseline.     ED Results / Procedures / Treatments   Labs (all labs ordered are listed, but only abnormal results are displayed) Labs Reviewed - No data to display  EKG None  Radiology No results found.  Procedures Procedures    Medications Ordered in ED Medications  lidocaine (XYLOCAINE) 2 % viscous mouth solution 15 mL (15 mLs Mouth/Throat Given 02/21/24 1153)    ED Course/ Medical Decision Making/ A&P                                 Medical Decision Making Risk Prescription drug management.   This patient presents to the ED for concern of nose bleed, this involves an extensive number of treatment  options, and is a complaint that carries with it a high risk of complications and morbidity.  The differential diagnosis includes nosebleed, foreign body, fracture, dry skin, nasal trauma   Additional history obtained:  Additional history obtained from recent ED visit for same 02/19/2024     Problem List / ED Course / Critical interventions / Medication management  Patient presenting with nosebleed.  He is hemodynamically stable and well appearing.  He has mild amount of bright red blood coming from left nare.  On my initial evaluation patient refusing Afrin secondary to it causing discomfort when he tried it 3 days ago.  He blew out clots and I visualized left nare bleeding.  Direct pressure x15 minutes did not improve bleeding.  Soaked gauze and viscous lidocaine.   On reassessment patient I did not visualize any further bleeding.  We discussed nasal packing if bleeding continued however patient adamantly declined.  Ultimately he would like to follow-up with ENT for recurrent nosebleed.  He will return with new or worsening symptoms.  Patient has been hemodynamically stable throughout stay.  After bleeding discontinued he was observed for approximately 30 minutes.  He did not have any episodes of rebleeding.  I have reviewed the patients home medicines and have made adjustments as needed   Plan  F/u w/ PCP in 2-3d to ensure resolution of sx.  Patient was given return precautions. Patient stable for discharge at this time.  Patient educated on sx/dx and verbalized understanding of plan. Return to ER w/ new or worsening sx.          Final Clinical Impression(s) / ED Diagnoses Final diagnoses:  Epistaxis    Rx / DC Orders ED Discharge Orders     None         Smitty Knudsen, PA-C 02/21/24 1654    Gloris Manchester, MD 02/25/24 [phone]

## 2024-02-23 ENCOUNTER — Institutional Professional Consult (permissible substitution) (INDEPENDENT_AMBULATORY_CARE_PROVIDER_SITE_OTHER): Payer: Self-pay | Admitting: Otolaryngology

## 2024-05-03 ENCOUNTER — Encounter (HOSPITAL_BASED_OUTPATIENT_CLINIC_OR_DEPARTMENT_OTHER): Payer: Self-pay

## 2024-05-03 ENCOUNTER — Other Ambulatory Visit: Payer: Self-pay

## 2024-05-03 ENCOUNTER — Emergency Department (HOSPITAL_BASED_OUTPATIENT_CLINIC_OR_DEPARTMENT_OTHER)
Admission: EM | Admit: 2024-05-03 | Discharge: 2024-05-03 | Disposition: A | Discharge status: Home / Self Care | Payer: Worker's Compensation | Attending: Emergency Medicine | Admitting: Emergency Medicine

## 2024-05-03 ENCOUNTER — Emergency Department (HOSPITAL_BASED_OUTPATIENT_CLINIC_OR_DEPARTMENT_OTHER): Payer: Self-pay | Admitting: Radiology

## 2024-05-03 DIAGNOSIS — Y990 Civilian activity done for income or pay: Secondary | ICD-10-CM | POA: Diagnosis not present

## 2024-05-03 DIAGNOSIS — S90112A Contusion of left great toe without damage to nail, initial encounter: Secondary | ICD-10-CM | POA: Diagnosis not present

## 2024-05-03 DIAGNOSIS — S90119A Contusion of unspecified great toe without damage to nail, initial encounter: Secondary | ICD-10-CM

## 2024-05-03 DIAGNOSIS — W208XXA Other cause of strike by thrown, projected or falling object, initial encounter: Secondary | ICD-10-CM | POA: Diagnosis not present

## 2024-05-03 DIAGNOSIS — S99922A Unspecified injury of left foot, initial encounter: Secondary | ICD-10-CM | POA: Diagnosis present

## 2024-05-03 MED ORDER — NAPROXEN 250 MG PO TABS
500.0000 mg | ORAL_TABLET | Freq: Once | ORAL | Status: AC
  Administered 2024-05-03: 500 mg via ORAL
  Filled 2024-05-03: qty 2

## 2024-05-03 MED ORDER — ACETAMINOPHEN 500 MG PO TABS: 500.0000 mg | ORAL_TABLET | Freq: Four times a day (QID) | ORAL | 0 refills | Status: AC | PRN

## 2024-05-03 MED ORDER — NAPROXEN 500 MG PO TABS: 500.0000 mg | ORAL_TABLET | Freq: Two times a day (BID) | ORAL | 0 refills | Status: AC

## 2024-05-03 MED ORDER — OXYCODONE-ACETAMINOPHEN 5-325 MG PO TABS
1.0000 | ORAL_TABLET | ORAL | Status: DC | PRN
  Administered 2024-05-03: 1 via ORAL
  Filled 2024-05-03: qty 1

## 2024-05-03 NOTE — Discharge Instructions (Signed)
 You were seen in the ER for toe injury.  The x-ray does not reveal any evidence of fracture. You likely have deep bruise. We recommend that you follow-up with orthopedic surgery in 7 to 10 days if the pain continues.  Follow the RICE instructions that have been provided. Keep using the postop shoe for any ambulation.  Keep the leg elevated when not walking.  Work note has been provided.

## 2024-05-03 NOTE — ED Triage Notes (Signed)
 Pt reports dropping pallet on left big toe last night at work. Anterior part of big toe is bruised and mildly swollen.

## 2024-05-03 NOTE — ED Provider Notes (Signed)
 [place] EMERGENCY DEPARTMENT AT [facility] Provider Note   CSN: [id number] Arrival date & time: 05/03/24  4098     History  Chief Complaint  Patient presents with   Foot Injury    Bobby Holmes is a 48 y.o. male.  HPI    48 year old male comes in with chief complaint of foot injury.  Patient states that last night, while at work he dropped a wooden pallet onto his left great toe.  He was able to finish his shift, but as soon as he got home and removed his socks he noticed pain and swelling.  Overnight, the pain persisted and this morning he is noticing a bruise around his toe.  Patient has significant pain around the bruise site.  He has been able to ambulate.  He came POV.  Home Medications Prior to Admission medications   Medication Sig Start Date End Date Taking? Authorizing Provider  acetaminophen  (TYLENOL ) 500 MG tablet Take 1 tablet (500 mg total) by mouth every 6 (six) hours as needed. 05/03/24  Yes Deatra Face, MD  naproxen  (NAPROSYN ) 500 MG tablet Take 1 tablet (500 mg total) by mouth 2 (two) times daily. 05/03/24  Yes Teryn Gust, MD  amLODipine (NORVASC) 5 MG tablet Take 5 mg by mouth daily. 11/11/22   [provider]  cyclobenzaprine  (FLEXERIL ) 10 MG tablet Take 1 tablet (10 mg total) by mouth 2 (two) times daily as needed for muscle spasms. 09/12/20   Early Glisson, MD  famotidine  (PEPCID ) 20 MG tablet Take 1 tablet (20 mg total) by mouth 2 (two) times daily for 10 days. 05/11/23 05/21/23  Meridian Bureau, PA-C  ibuprofen  (ADVIL ,MOTRIN ) 200 MG tablet Take 400 mg by mouth every 6 (six) hours as needed for moderate pain.    [provider]  omeprazole  (PRILOSEC) 20 MG capsule Take 20 mg by mouth daily. 11/11/22   [provider]  ondansetron  (ZOFRAN -ODT) 8 MG disintegrating tablet Take 1 tablet (8 mg total) by mouth every 8 (eight) hours as needed for nausea or vomiting. 12/28/21   Trish Furl, MD      Allergies    Patient  has no known allergies.    Review of Systems   Review of Systems  Physical Exam Updated Vital Signs BP (!) 142/94   Pulse 63   Temp 99.1 F (37.3 C) (Oral)   Resp 18   Ht 5\' 10"  (1.778 m)   Wt 76.2 kg   SpO2 97%   BMI 24.11 kg/m  Physical Exam Vitals and nursing note reviewed.  Constitutional:      Appearance: He is well-developed.  HENT:     Head: Atraumatic.  Cardiovascular:     Rate and Rhythm: Normal rate.  Pulmonary:     Effort: Pulmonary effort is normal.  Musculoskeletal:        General: Tenderness present.     Cervical back: Neck supple.     Comments: Patient's left great toe is slightly edematous with bruising noted just below the nailbed.  There is no subungual hematoma appreciated.  The nail itself is intact.  Patient has discomfort with movement of the toe.  No tenderness over the MTP joint however  Skin:    General: Skin is warm.     Findings: Bruising present.  Neurological:     Mental Status: He is alert and oriented to person, place, and time.     ED Results / Procedures / Treatments   Labs (all labs ordered are listed,  but only abnormal results are displayed) Labs Reviewed - No data to display  EKG None  Radiology DG Foot Complete Left Result Date: 05/03/2024 CLINICAL DATA:  Left big toe bruising after injury last night. EXAM: LEFT FOOT - COMPLETE 3+ VIEW COMPARISON:  None Available. FINDINGS: There is no evidence of fracture or dislocation. There is no evidence of arthropathy or other focal bone abnormality. Soft tissues are unremarkable. IMPRESSION: Negative. Electronically Signed   By: Rosalene Colon M.D.   On: 05/03/2024 08:08    Procedures Procedures    Medications Ordered in ED Medications  oxyCODONE -acetaminophen  (PERCOCET/ROXICET) 5-325 MG per tablet 1 tablet (1 tablet Oral Given 05/03/24 1610)  naproxen  (NAPROSYN ) tablet 500 mg (500 mg Oral Given 05/03/24 0809)    ED Course/ Medical Decision Making/ A&P                                  Medical Decision Making Amount and/or Complexity of Data Reviewed Radiology: ordered.  Risk OTC drugs. Prescription drug management.   This patient presents to the ED with chief complaint(s) of toe pain after dropping a pallet on it.    The differential diagnosis includes : Contusion of the toe, fracture of the toe, soft tissue injury  The initial plan is to x-ray of the foot/toe. Most it appears the patient has localized injury.   Independent visualization and interpretation of imaging: - I independently visualized the following imaging with scope of interpretation limited to determining acute life threatening conditions related to emergency care: X-ray of the foot, which revealed no clear evidence of displaced fracture of the great toe.  Patient will be discharged with orthopedic surgery follow-up as needed.  Work note provided.   Final Clinical Impression(s) / ED Diagnoses Final diagnoses:  Work related injury  Contusion of great toe without damage to nail, unspecified laterality, initial encounter    Rx / DC Orders ED Discharge Orders          Ordered    naproxen  (NAPROSYN ) 500 MG tablet  2 times daily        05/03/24 0811    acetaminophen  (TYLENOL ) 500 MG tablet  Every 6 hours PRN        05/03/24 0811              Deatra Face, MD 05/03/24 1036

## 2024-06-13 ENCOUNTER — Emergency Department (HOSPITAL_BASED_OUTPATIENT_CLINIC_OR_DEPARTMENT_OTHER)
Admission: EM | Admit: 2024-06-13 | Discharge: 2024-06-13 | Disposition: A | Discharge status: Home / Self Care | Payer: Self-pay | Source: Ambulatory Visit | Attending: Emergency Medicine | Admitting: Emergency Medicine

## 2024-06-13 ENCOUNTER — Emergency Department (HOSPITAL_BASED_OUTPATIENT_CLINIC_OR_DEPARTMENT_OTHER): Payer: Self-pay | Admitting: Radiology

## 2024-06-13 DIAGNOSIS — R079 Chest pain, unspecified: Secondary | ICD-10-CM | POA: Insufficient documentation

## 2024-06-13 DIAGNOSIS — F17210 Nicotine dependence, cigarettes, uncomplicated: Secondary | ICD-10-CM | POA: Insufficient documentation

## 2024-06-13 DIAGNOSIS — I10 Essential (primary) hypertension: Secondary | ICD-10-CM | POA: Insufficient documentation

## 2024-06-13 DIAGNOSIS — Z79899 Other long term (current) drug therapy: Secondary | ICD-10-CM | POA: Insufficient documentation

## 2024-06-13 LAB — TROPONIN T, HIGH SENSITIVITY
Troponin T High Sensitivity: <15 ng/L (ref <19)
Troponin T High Sensitivity: <15 ng/L (ref <19)

## 2024-06-13 LAB — CBC
HCT: 39.3 % (ref 39.0–52.0)
Hemoglobin: 13.3 g/dL (ref 13.0–17.0)
MCH: 29.8 pg (ref 26.0–34.0)
MCHC: 33.8 g/dL (ref 30.0–36.0)
MCV: 87.9 fL (ref 80.0–100.0)
Platelets: 222 10*3/uL (ref 150–400)
RBC: 4.47 MIL/uL (ref 4.22–5.81)
RDW: 19.9 % — ABNORMAL HIGH (ref 11.5–15.5)
WBC: 5.6 10*3/uL (ref 4.0–10.5)
nRBC: 0 % (ref 0.0–0.2)

## 2024-06-13 LAB — COMPREHENSIVE METABOLIC PANEL WITH GFR
ALT: 26 U/L (ref 0–44)
AST: 52 U/L — ABNORMAL HIGH (ref 15–41)
Albumin: 4.7 g/dL (ref 3.5–5.0)
Alkaline Phosphatase: 67 U/L (ref 38–126)
Anion gap: 20 — ABNORMAL HIGH (ref 5–15)
BUN: 12 mg/dL (ref 6–20)
CO2: 23 mmol/L (ref 22–32)
Calcium: 10.3 mg/dL (ref 8.9–10.3)
Chloride: 98 mmol/L (ref 98–111)
Creatinine, Ser: 0.77 mg/dL (ref 0.61–1.24)
GFR, Estimated: >60 mL/min (ref >60)
Glucose, Bld: 82 mg/dL (ref 70–99)
Potassium: 4.3 mmol/L (ref 3.5–5.1)
Sodium: 141 mmol/L (ref 135–145)
Total Bilirubin: 1.4 mg/dL — ABNORMAL HIGH (ref 0.0–1.2)
Total Protein: 8.2 g/dL — ABNORMAL HIGH (ref 6.5–8.1)

## 2024-06-13 LAB — LIPASE, BLOOD: Lipase: 21 U/L (ref 11–51)

## 2024-06-13 LAB — MAGNESIUM: Magnesium: 1.6 mg/dL — ABNORMAL LOW (ref 1.7–2.4)

## 2024-06-13 MED ORDER — AMLODIPINE BESYLATE 5 MG PO TABS
5.0000 mg | ORAL_TABLET | Freq: Once | ORAL | Status: AC
  Administered 2024-06-13: 5 mg via ORAL
  Filled 2024-06-13: qty 1

## 2024-06-13 MED ORDER — PANTOPRAZOLE SODIUM 20 MG PO TBEC: 20.0000 mg | DELAYED_RELEASE_TABLET | Freq: Every day | ORAL | 1 refills | Status: AC

## 2024-06-13 MED ORDER — PROMETHAZINE HCL 25 MG/ML IJ SOLN
INTRAMUSCULAR | Status: AC
  Administered 2024-06-13: 25 mg
  Filled 2024-06-13: qty 1

## 2024-06-13 MED ORDER — ONDANSETRON HCL 4 MG/2ML IJ SOLN
4.0000 mg | Freq: Once | INTRAMUSCULAR | Status: AC
  Administered 2024-06-13: 4 mg via INTRAVENOUS
  Filled 2024-06-13: qty 2

## 2024-06-13 MED ORDER — ALUM & MAG HYDROXIDE-SIMETH 200-200-20 MG/5ML PO SUSP
30.0000 mL | Freq: Once | ORAL | Status: AC
  Administered 2024-06-13: 30 mL via ORAL
  Filled 2024-06-13: qty 30

## 2024-06-13 MED ORDER — FAMOTIDINE 20 MG PO TABS
20.0000 mg | ORAL_TABLET | Freq: Once | ORAL | Status: AC
  Administered 2024-06-13: 20 mg via ORAL
  Filled 2024-06-13: qty 1

## 2024-06-13 MED ORDER — SODIUM CHLORIDE 0.9 % IV SOLN
12.5000 mg | Freq: Four times a day (QID) | INTRAVENOUS | Status: DC | PRN
  Administered 2024-06-13: 12.5 mg via INTRAVENOUS
  Filled 2024-06-13: qty 0.5

## 2024-06-13 MED ORDER — AMLODIPINE BESYLATE 5 MG PO TABS: 5.0000 mg | ORAL_TABLET | Freq: Every day | ORAL | 1 refills | Status: AC

## 2024-06-13 MED ORDER — ACETAMINOPHEN 500 MG PO TABS
1000.0000 mg | ORAL_TABLET | Freq: Once | ORAL | Status: AC
  Administered 2024-06-13: 1000 mg via ORAL
  Filled 2024-06-13: qty 2

## 2024-06-13 MED ORDER — MAGNESIUM OXIDE -MG SUPPLEMENT 400 (240 MG) MG PO TABS
400.0000 mg | ORAL_TABLET | Freq: Once | ORAL | Status: AC
  Administered 2024-06-13: 400 mg via ORAL
  Filled 2024-06-13: qty 1

## 2024-06-13 MED ORDER — METHOCARBAMOL 500 MG PO TABS: 500.0000 mg | ORAL_TABLET | Freq: Two times a day (BID) | ORAL | 0 refills | Status: AC | PRN

## 2024-06-13 MED ORDER — SODIUM CHLORIDE 0.9 % IV BOLUS
1000.0000 mL | Freq: Once | INTRAVENOUS | Status: AC
  Administered 2024-06-13: 1000 mL via INTRAVENOUS

## 2024-06-13 NOTE — Discharge Instructions (Addendum)
 As discussed, your workup today was reassuring.  Both of your heart enzymes were normal 50 I think you are having a heart attack.  Your chest x-ray did not show obvious pneumonia, collapsed lung or other acute cardiopulmonary abnormality.  Your laboratory studies from abdominal perspective also were reassuring.  Will send you home with medicine to take for reflux related symptoms.  You may get over-the-counter Maalox or Tums for any breakthrough symptoms but will prescribe Protonix  for treatment of your baseline symptoms.  Recommend follow-up with primary care/GI the outpatient setting for reassessment.  Will also begin blood pressure medicine to take daily.  Recommend logging your blood pressure once in the morning and once in the evening and following up with primary care for continued blood pressure management.  Please do not hesitate to return to the emergency department if the worrisome signs and symptoms we discussed become apparent.

## 2024-06-13 NOTE — ED Triage Notes (Signed)
 C/o heartburn and HTN last night. Denies CP or SHOB. Sent by PCP.

## 2024-06-13 NOTE — ED Notes (Signed)
PA made aware of pt's BP.

## 2024-06-13 NOTE — ED Notes (Signed)
Reviewed discharge instructions, medications, and home care with pt. Pt verbalized understanding and had no further questions. Pt exited ED without complications.

## 2024-06-13 NOTE — ED Notes (Signed)
Patient transported to X-Ray 

## 2024-06-13 NOTE — ED Provider Notes (Signed)
 [place] EMERGENCY DEPARTMENT AT [facility] Provider Note   CSN: [id number] Arrival date & time: 06/13/24  1434     Patient presents with: Hypertension   Bobby Holmes is a 48 y.o. male.    Hypertension   48 year old male presents emergency department complaints of elevated blood pressure, and chest burning.  States that he woke up this morning around 6 to 7 AM with burning in his chest.  States he has a history of reflux and for this felt similar.  Checked his blood pressure and it was elevated.  Went to an urgent care and was found to have hypertension blood pressure 180 systolic prompting visit to the ED.  States he supposed to been on blood pressure medication but never picked it up due to financial strains.  States that his chest burning sensation has been present ever since this morning.  Has tried no medication specifically for this.  Denies any shortness of breath, cough, fever, abdominal pain.  Does report nausea with a few episodes of emesis.  Denies any urinary symptoms, change in bowel habits.  Patient also states that he was at the beach over the weekend and had more than usual amount of alcohol consumption of which he continued through Monday/Tuesday of this week.    Past medical history significant for hypertension, OA  Prior to Admission medications   Medication Sig Start Date End Date Taking? Authorizing Provider  amLODipine  (NORVASC ) 5 MG tablet Take 1 tablet (5 mg total) by mouth daily. 06/13/24  Yes Silver Fell A, PA  methocarbamol  (ROBAXIN ) 500 MG tablet Take 1 tablet (500 mg total) by mouth 2 (two) times daily as needed for muscle spasms. 06/13/24  Yes Silver Fell A, PA  pantoprazole  (PROTONIX ) 20 MG tablet Take 1 tablet (20 mg total) by mouth daily. 06/13/24  Yes Silver Fell A, PA  acetaminophen  (TYLENOL ) 500 MG tablet Take 1 tablet (500 mg total) by mouth every 6 (six) hours as needed. 05/03/24   Charlyn Sora, MD  cyclobenzaprine  (FLEXERIL ) 10  MG tablet Take 1 tablet (10 mg total) by mouth 2 (two) times daily as needed for muscle spasms. 09/12/20   Cleotilde Rogue, MD  ibuprofen  (ADVIL ,MOTRIN ) 200 MG tablet Take 400 mg by mouth every 6 (six) hours as needed for moderate pain.    [provider]  naproxen  (NAPROSYN ) 500 MG tablet Take 1 tablet (500 mg total) by mouth 2 (two) times daily. 05/03/24   Charlyn Sora, MD  ondansetron  (ZOFRAN -ODT) 8 MG disintegrating tablet Take 1 tablet (8 mg total) by mouth every 8 (eight) hours as needed for nausea or vomiting. 12/28/21   Randol Simmonds, MD    Allergies: Patient has no known allergies.    Review of Systems  All other systems reviewed and are negative.   Updated Vital Signs BP (!) 168/106   Pulse 75   Temp 98.7 F (37.1 C) (Oral)   Resp (!) 23   SpO2 99%   Physical Exam Vitals and nursing note reviewed.  Constitutional:      General: He is not in acute distress.    Appearance: He is well-developed.  HENT:     Head: Normocephalic and atraumatic.  Eyes:     Conjunctiva/sclera: Conjunctivae normal.  Cardiovascular:     Rate and Rhythm: Normal rate and regular rhythm.     Pulses: Normal pulses.     Heart sounds: No murmur heard. Pulmonary:     Effort: Pulmonary effort is normal. No respiratory distress.  Breath sounds: Normal breath sounds. No wheezing, rhonchi or rales.  Chest:     Chest wall: No tenderness.  Abdominal:     Palpations: Abdomen is soft.     Tenderness: There is no abdominal tenderness.  Musculoskeletal:        General: No swelling.     Cervical back: Neck supple.  Skin:    General: Skin is warm and dry.     Capillary Refill: Capillary refill takes less than 2 seconds.  Neurological:     Mental Status: He is alert.  Psychiatric:        Mood and Affect: Mood normal.     (all labs ordered are listed, but only abnormal results are displayed) Labs Reviewed  COMPREHENSIVE METABOLIC PANEL WITH GFR - Abnormal; Notable for the following  components:      Result Value   Total Protein 8.2 (*)    AST 52 (*)    Total Bilirubin 1.4 (*)    Anion gap 20 (*)    All other components within normal limits  CBC - Abnormal; Notable for the following components:   RDW 19.9 (*)    All other components within normal limits  MAGNESIUM  - Abnormal; Notable for the following components:   Magnesium  1.6 (*)    All other components within normal limits  LIPASE, BLOOD  TROPONIN T, HIGH SENSITIVITY  TROPONIN T, HIGH SENSITIVITY    EKG: EKG Interpretation Date/Time:  Wednesday June 13 2024 18:24:33 EDT Ventricular Rate:  69 PR Interval:  184 QRS Duration:  108 QT Interval:  515 QTC Calculation: 552 R Axis:   -47  Text Interpretation: 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  Heartburn EXAM: CHEST - 2 VIEW COMPARISON:  Chest x-ray 01/29/2016. FINDINGS: Hyperinflation with chronic lung changes. No consolidation, pneumothorax or effusion. No edema. Normal cardiopericardial silhouette. Overlapping cardiac leads. Degenerative changes on lateral view. IMPRESSION: Hyperinflation with chronic lung changes. No acute cardiopulmonary disease. Electronically Signed   By: Ranell Bring M.D.   On: 06/13/2024 16:11     Procedures   Medications Ordered in the ED  promethazine  (PHENERGAN ) 12.5 mg in sodium chloride  0.9 % 50 mL IVPB (0 mg Intravenous Stopped 06/13/24 1821)  sodium chloride  0.9 % bolus 1,000 mL (0 mLs Intravenous Stopped 06/13/24 1728)  ondansetron  (ZOFRAN ) injection 4 mg (4 mg Intravenous Given 06/13/24 1543)  famotidine  (PEPCID ) tablet 20 mg (20 mg Oral Given 06/13/24 1532)  alum & mag hydroxide-simeth (MAALOX/MYLANTA) 200-200-20 MG/5ML suspension 30 mL (30 mLs Oral Given 06/13/24 1531)  acetaminophen  (TYLENOL )  tablet 1,000 mg (1,000 mg Oral Given 06/13/24 1638)  magnesium  oxide (MAG-OX) tablet 400 mg (400 mg Oral Given 06/13/24 1638)  amLODipine  (NORVASC ) tablet 5 mg (5 mg Oral Given 06/13/24 1750)  promethazine  (PHENERGAN ) 25 MG/ML injection (25 mg  Given 06/13/24 1757)                                    Medical Decision Making Amount and/or Complexity of Data Reviewed Labs: ordered. Radiology: ordered.  Risk OTC drugs. Prescription drug management.   This patient presents to the ED for concern of chest pain, elevated blood pressure, this involves an extensive number of treatment options, and is a  complaint that carries with it a high risk of complications and morbidity.  The differential diagnosis includes CS, PE, pneumothorax, GERD, pneumonia, aortic dissection, pancreatitis, cholelithiasis, cholecystitis, other   Co morbidities that complicate the patient evaluation  See HPI   Additional history obtained:  Additional history obtained from EMR External records from outside source obtained and reviewed including hospital records   Lab Tests:  I Ordered, and personally interpreted labs.  The pertinent results include: Initial troponin of less than 15 with repeat less than 15.  Mild hypomagnesemia at 1.6 otherwise, addressed with normal limits.  AST of 52 otherwise, normal ALT.  No renal dysfunction.  Lipase within normal limits.   Imaging Studies ordered:  I ordered imaging studies including chest x-ray I independently visualized and interpreted imaging which showed no acute cardiopulmonary abnormality I agree with the radiologist interpretation   Cardiac Monitoring: / EKG:  The patient was maintained on a cardiac monitor.  I personally viewed and interpreted the cardiac monitored which showed an underlying rhythm of: Sinus rhythm.  Incomplete right bundle branch and LAFB.  Anterior septal infarct.  Abnormal T wave Diffuse leads.  Prolonged QTc.  Consultations  Obtained:  N/a   Problem List / ED Course / Critical interventions / Medication management  Hypertension, chest pain I ordered medication including magnesium  oxide, Pepcid , Maalox, Tylenol , normal saline, amlodipine , promethazine    Reevaluation of the patient after these medicines showed that the patient improved I have reviewed the patients home medicines and have made adjustments as needed   Social Determinants of Health:  Cigarette use.  Denies illicit drug use.   Test / Admission - Considered:  Hypertension, chest pain Vitals signs significant for hypertension blood pressure 159/103. Otherwise within normal range and stable throughout visit. Laboratory/imaging studies significant for: See above 48 year old male presents emergency department complaints of elevated blood pressure, and chest burning.  States that he woke up this morning around 6 to 7 AM with burning in his chest.  States he has a history of reflux and for this felt similar.  Checked his blood pressure and it was elevated.  Went to an urgent care and was found to have hypertension blood pressure 180 systolic prompting visit to the ED.  States he supposed to been on blood pressure medication but never picked it up due to financial strains.  States that his chest burning sensation has been present ever since this morning.  Has tried no medication specifically for this.  Denies any shortness of breath, cough, fever, abdominal pain.  Does report nausea with a few episodes of emesis.  Denies any urinary symptoms, change in bowel habits.  Patient also states that he was at the beach over the weekend and had more than usual amount of alcohol consumption of which he continued through Monday/Tuesday of this week.   On exam, lungs clear to auscultation bilaterally.  No abdominal tenderness.  No chest wall tenderness.  Patient with workup today reassuring.  Delta negative troponin; EKG with findings as above with no prior to compare;  symptoms do not seem consistent with ACS.  Chest x-ray without obvious pneumonia, pneumothorax or other acute cardiopulmonary abnormality.  Patient without chest pain rating to back, pulse deficits, neurodeficits; low suspicion for aortic dissection.  Wells PE 0 and PERC negative; low suspicion for PE.  Patient did note improvement of chest burning sensation with GI cocktail; suspect GI source.  Will begin PPI.  Patient does have known hypertension but has not been taking antihypertensive medication;  will refill prescription that he is supposed be on recommend follow-up with primary care.  Treatment plan discussed with patient and he acknowledged understanding was agreeable to said plan.  Patient overall well-appearing, afebrile in no acute distress. Worrisome signs and symptoms were discussed with the patient, and the patient acknowledged understanding to return to the ED if noticed. Patient was stable upon discharge.       Final diagnoses:  Chest pain, unspecified type  Hypertension, unspecified type    ED Discharge Orders          Ordered    methocarbamol  (ROBAXIN ) 500 MG tablet  2 times daily PRN        06/13/24 1829    amLODipine  (NORVASC ) 5 MG tablet  Daily        06/13/24 1829    pantoprazole  (PROTONIX ) 20 MG tablet  Daily        06/13/24 1829               Silver Wonda LABOR, [place] 06/13/24 1856    Yolande Lamar BROCKS, MD 06/19/24 1008
